# Patient Record
Sex: Male | Born: 1960 | Race: White | Hispanic: No | Marital: Married | Smoking: Current some day smoker
Health system: Southern US, Community
[De-identification: ages and names within clinical notes are randomized; demographics above are authoritative.]

## PROBLEM LIST (undated history)

## (undated) DIAGNOSIS — Z973 Presence of spectacles and contact lenses: Secondary | ICD-10-CM

## (undated) DIAGNOSIS — G4730 Sleep apnea, unspecified: Secondary | ICD-10-CM

## (undated) DIAGNOSIS — F329 Major depressive disorder, single episode, unspecified: Secondary | ICD-10-CM

## (undated) DIAGNOSIS — I10 Essential (primary) hypertension: Secondary | ICD-10-CM

## (undated) DIAGNOSIS — R972 Elevated prostate specific antigen [PSA]: Secondary | ICD-10-CM

## (undated) DIAGNOSIS — J189 Pneumonia, unspecified organism: Secondary | ICD-10-CM

## (undated) DIAGNOSIS — M1990 Unspecified osteoarthritis, unspecified site: Secondary | ICD-10-CM

## (undated) DIAGNOSIS — I82409 Acute embolism and thrombosis of unspecified deep veins of unspecified lower extremity: Secondary | ICD-10-CM

## (undated) DIAGNOSIS — F32A Depression, unspecified: Secondary | ICD-10-CM

## (undated) DIAGNOSIS — K219 Gastro-esophageal reflux disease without esophagitis: Secondary | ICD-10-CM

## (undated) DIAGNOSIS — N529 Male erectile dysfunction, unspecified: Secondary | ICD-10-CM

## (undated) DIAGNOSIS — D122 Benign neoplasm of ascending colon: Secondary | ICD-10-CM

## (undated) DIAGNOSIS — F419 Anxiety disorder, unspecified: Secondary | ICD-10-CM

## (undated) HISTORY — PX: SEPTOPLASTY: SUR1290

## (undated) HISTORY — PX: KNEE ARTHROSCOPY W/ ACL RECONSTRUCTION: SHX1858

## (undated) HISTORY — PX: COLONOSCOPY: SHX174

## (undated) HISTORY — PX: WISDOM TOOTH EXTRACTION: SHX21

---

## 1992-07-20 DIAGNOSIS — J189 Pneumonia, unspecified organism: Secondary | ICD-10-CM

## 1992-07-20 HISTORY — DX: Pneumonia, unspecified organism: J18.9

## 2005-06-09 ENCOUNTER — Ambulatory Visit: Payer: Self-pay | Admitting: Orthopaedic Surgery

## 2005-12-09 ENCOUNTER — Ambulatory Visit: Payer: Self-pay | Admitting: Family Medicine

## 2007-05-13 ENCOUNTER — Ambulatory Visit: Payer: Self-pay | Admitting: Family Medicine

## 2010-10-13 ENCOUNTER — Ambulatory Visit: Payer: Self-pay | Admitting: Family Medicine

## 2010-10-13 IMAGING — CR RIGHT FOOT COMPLETE - 3+ VIEW
1 series · 3 of 3 positions shown · non-contrast
Comparison: none

REASON FOR EXAM: right foot pain
COMMENTS:

PROCEDURE:     KDR - KDXR FOOT RT COMPLETE W/OBLIQUES  - [DATE]  [DATE]
RESULT:     There does not appear to be evidence of fracture, dislocation or
malalignment.  No evidence of soft tissue swelling is appreciated.

[Series 1: view not recorded · 0.17mm/px · 3 of 3 slices shown]
[im 1/3]
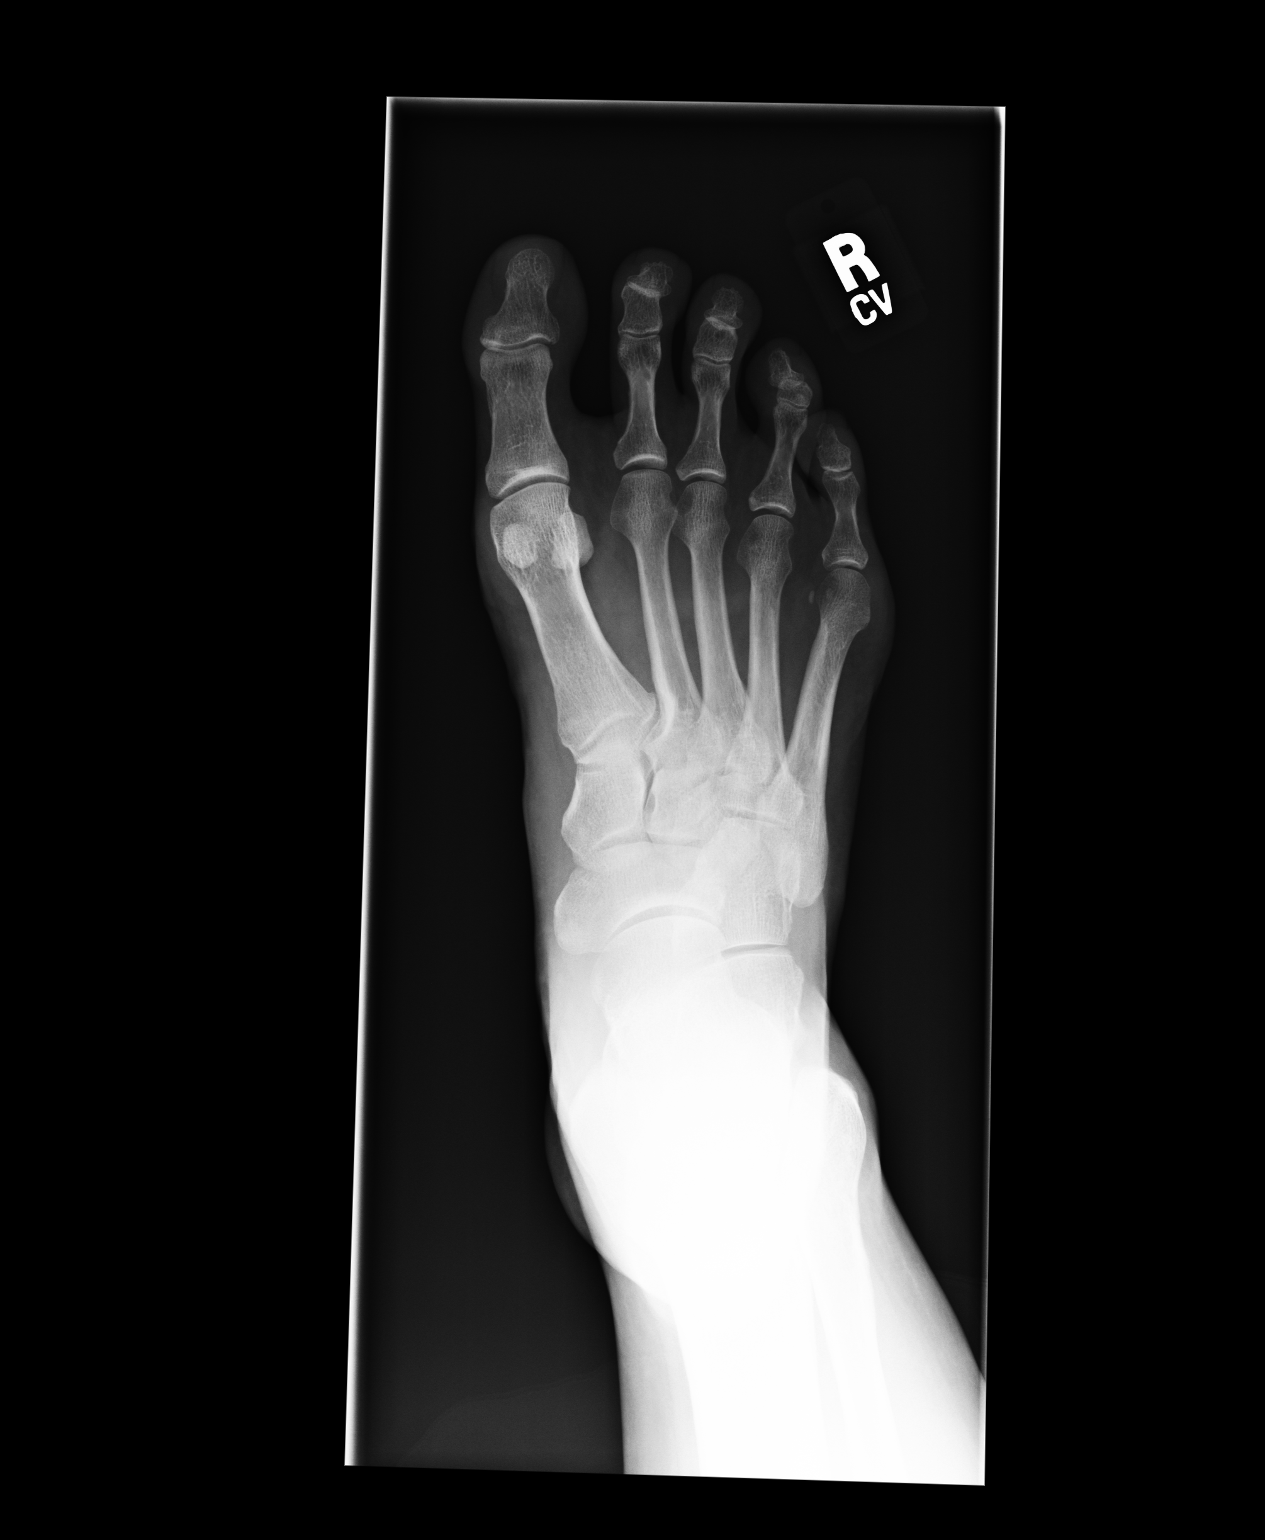
[im 2/3]
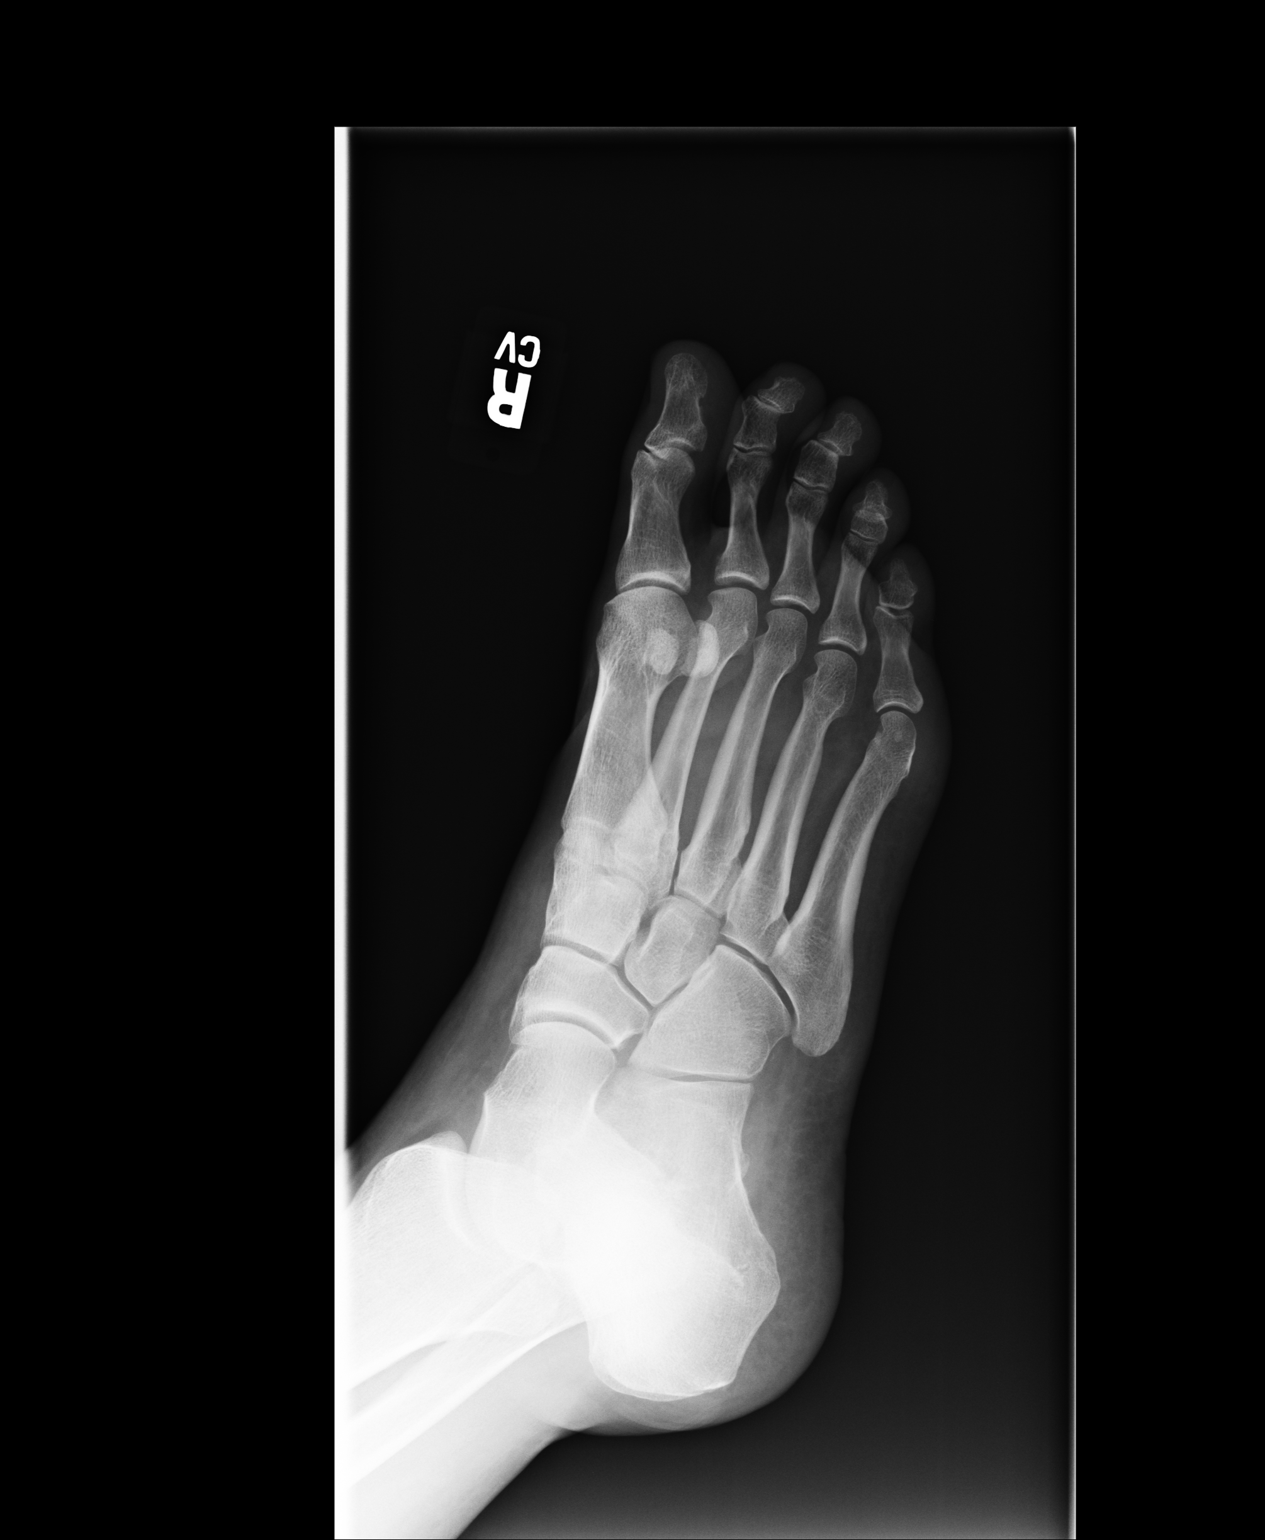
[im 3/3]
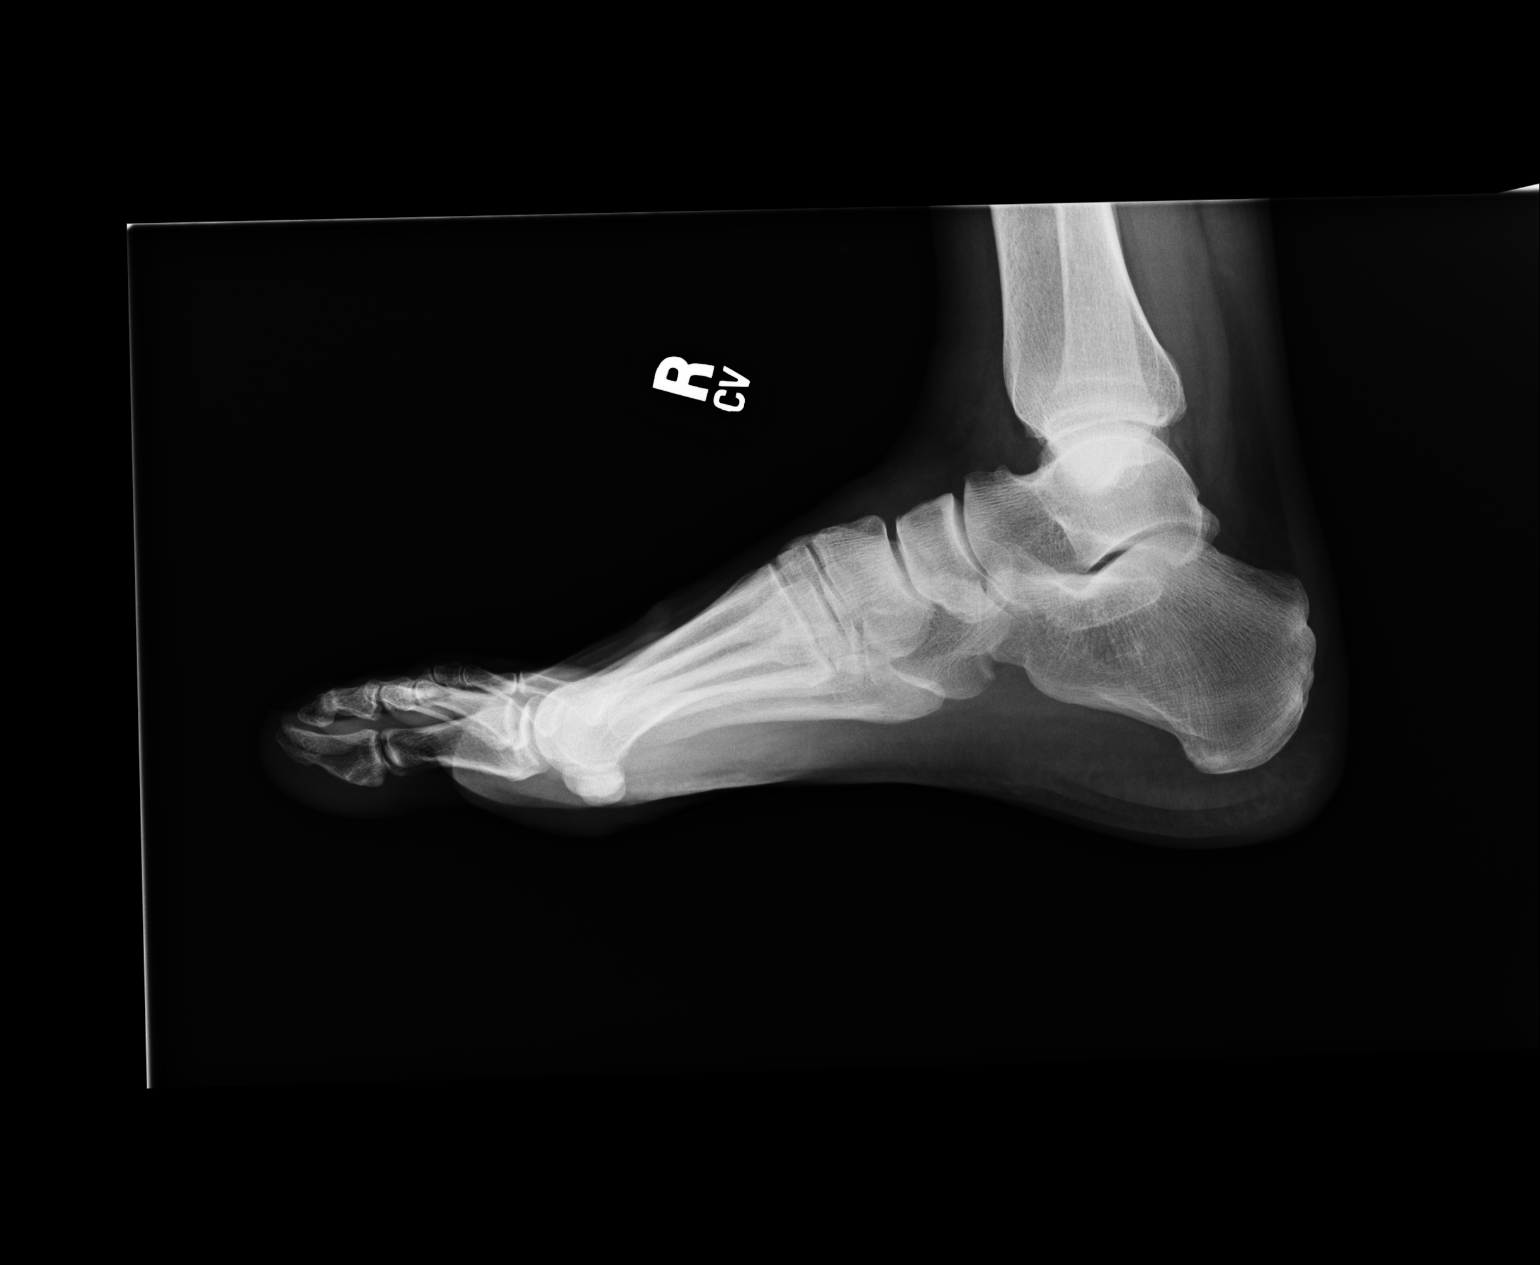

[3 of 3 positions shown; findings below may reference images not displayed]

IMPRESSION: No evidence of acute abnormalities.  If there are persistent complaints of
pain or persistent clinical concern, a repeat evaluation in 7-10 days is
recommended if clinically warranted.

## 2011-12-18 ENCOUNTER — Ambulatory Visit: Payer: Self-pay | Admitting: Orthopedic Surgery

## 2015-08-09 DIAGNOSIS — F4321 Adjustment disorder with depressed mood: Secondary | ICD-10-CM | POA: Diagnosis not present

## 2015-09-19 DIAGNOSIS — F4321 Adjustment disorder with depressed mood: Secondary | ICD-10-CM | POA: Diagnosis not present

## 2015-11-25 DIAGNOSIS — F331 Major depressive disorder, recurrent, moderate: Secondary | ICD-10-CM | POA: Diagnosis not present

## 2015-11-25 DIAGNOSIS — F900 Attention-deficit hyperactivity disorder, predominantly inattentive type: Secondary | ICD-10-CM | POA: Diagnosis not present

## 2015-11-29 DIAGNOSIS — F4321 Adjustment disorder with depressed mood: Secondary | ICD-10-CM | POA: Diagnosis not present

## 2016-01-28 ENCOUNTER — Other Ambulatory Visit: Payer: Self-pay | Admitting: Internal Medicine

## 2016-01-28 ENCOUNTER — Ambulatory Visit
Admission: RE | Admit: 2016-01-28 | Discharge: 2016-01-28 | Disposition: A | Discharge status: Home / Self Care | Payer: 59 | Source: Ambulatory Visit | Attending: Internal Medicine | Admitting: Internal Medicine

## 2016-01-28 DIAGNOSIS — M545 Low back pain: Secondary | ICD-10-CM

## 2016-01-28 DIAGNOSIS — M47816 Spondylosis without myelopathy or radiculopathy, lumbar region: Secondary | ICD-10-CM | POA: Diagnosis not present

## 2016-01-28 DIAGNOSIS — R52 Pain, unspecified: Secondary | ICD-10-CM

## 2016-03-27 DIAGNOSIS — F4321 Adjustment disorder with depressed mood: Secondary | ICD-10-CM | POA: Diagnosis not present

## 2016-05-08 DIAGNOSIS — F4321 Adjustment disorder with depressed mood: Secondary | ICD-10-CM | POA: Diagnosis not present

## 2016-07-10 DIAGNOSIS — F4321 Adjustment disorder with depressed mood: Secondary | ICD-10-CM | POA: Diagnosis not present

## 2016-08-07 DIAGNOSIS — F4321 Adjustment disorder with depressed mood: Secondary | ICD-10-CM | POA: Diagnosis not present

## 2016-09-01 DIAGNOSIS — G4733 Obstructive sleep apnea (adult) (pediatric): Secondary | ICD-10-CM | POA: Diagnosis not present

## 2016-09-14 DIAGNOSIS — G589 Mononeuropathy, unspecified: Secondary | ICD-10-CM | POA: Diagnosis not present

## 2016-09-25 DIAGNOSIS — F4321 Adjustment disorder with depressed mood: Secondary | ICD-10-CM | POA: Diagnosis not present

## 2016-12-04 DIAGNOSIS — K219 Gastro-esophageal reflux disease without esophagitis: Secondary | ICD-10-CM | POA: Diagnosis not present

## 2016-12-04 DIAGNOSIS — R1310 Dysphagia, unspecified: Secondary | ICD-10-CM | POA: Diagnosis not present

## 2016-12-04 DIAGNOSIS — Z8371 Family history of colonic polyps: Secondary | ICD-10-CM | POA: Diagnosis not present

## 2017-01-07 DIAGNOSIS — D175 Benign lipomatous neoplasm of intra-abdominal organs: Secondary | ICD-10-CM | POA: Diagnosis not present

## 2017-01-07 DIAGNOSIS — Z1211 Encounter for screening for malignant neoplasm of colon: Secondary | ICD-10-CM | POA: Diagnosis not present

## 2017-01-07 DIAGNOSIS — K210 Gastro-esophageal reflux disease with esophagitis: Secondary | ICD-10-CM | POA: Diagnosis not present

## 2017-01-07 DIAGNOSIS — Z8371 Family history of colonic polyps: Secondary | ICD-10-CM | POA: Diagnosis not present

## 2017-01-07 DIAGNOSIS — R1310 Dysphagia, unspecified: Secondary | ICD-10-CM | POA: Diagnosis not present

## 2017-01-08 DIAGNOSIS — F4321 Adjustment disorder with depressed mood: Secondary | ICD-10-CM | POA: Diagnosis not present

## 2017-02-22 DIAGNOSIS — J3489 Other specified disorders of nose and nasal sinuses: Secondary | ICD-10-CM | POA: Diagnosis not present

## 2017-02-22 DIAGNOSIS — J343 Hypertrophy of nasal turbinates: Secondary | ICD-10-CM | POA: Diagnosis not present

## 2017-02-22 DIAGNOSIS — R220 Localized swelling, mass and lump, head: Secondary | ICD-10-CM | POA: Diagnosis not present

## 2017-02-22 DIAGNOSIS — K219 Gastro-esophageal reflux disease without esophagitis: Secondary | ICD-10-CM | POA: Diagnosis not present

## 2017-02-22 DIAGNOSIS — J310 Chronic rhinitis: Secondary | ICD-10-CM | POA: Diagnosis not present

## 2017-02-22 DIAGNOSIS — J342 Deviated nasal septum: Secondary | ICD-10-CM | POA: Diagnosis not present

## 2017-03-03 DIAGNOSIS — G4733 Obstructive sleep apnea (adult) (pediatric): Secondary | ICD-10-CM | POA: Diagnosis not present

## 2017-03-12 ENCOUNTER — Other Ambulatory Visit: Payer: Self-pay | Admitting: Gastroenterology

## 2017-03-12 DIAGNOSIS — R0981 Nasal congestion: Secondary | ICD-10-CM | POA: Diagnosis not present

## 2017-03-12 DIAGNOSIS — R220 Localized swelling, mass and lump, head: Secondary | ICD-10-CM | POA: Diagnosis not present

## 2017-03-12 DIAGNOSIS — J343 Hypertrophy of nasal turbinates: Secondary | ICD-10-CM | POA: Diagnosis not present

## 2017-03-16 DIAGNOSIS — R0981 Nasal congestion: Secondary | ICD-10-CM | POA: Insufficient documentation

## 2017-03-16 DIAGNOSIS — J3489 Other specified disorders of nose and nasal sinuses: Secondary | ICD-10-CM | POA: Insufficient documentation

## 2017-03-16 DIAGNOSIS — J343 Hypertrophy of nasal turbinates: Secondary | ICD-10-CM | POA: Insufficient documentation

## 2017-04-02 DIAGNOSIS — F4321 Adjustment disorder with depressed mood: Secondary | ICD-10-CM | POA: Diagnosis not present

## 2017-04-21 ENCOUNTER — Encounter (HOSPITAL_COMMUNITY): Payer: Self-pay | Admitting: *Deleted

## 2017-04-21 NOTE — Progress Notes (Signed)
Pt denies SOB, chest pain, and being under the care of a cardiologist. Pt denies having a cardiac cath but stated that a stressecho was performed by Dr. 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 Pt added that a colonoscopy  was just performed 2 months ago. Pt made aware to stop taking Aspirin, vitamins, fish oil and herbal medications. Do not take any NSAIDs ie: Ibuprofen, Advil, Naproxen(Aleve), Motrin, BC and Goody Powder. Pt verbalized understanding of all pre-op instructions.

## 2017-04-27 ENCOUNTER — Encounter (HOSPITAL_COMMUNITY): Payer: Self-pay | Admitting: *Deleted

## 2017-05-04 ENCOUNTER — Ambulatory Visit (HOSPITAL_COMMUNITY)
Admission: RE | Admit: 2017-05-04 | Discharge: 2017-05-04 | Disposition: A | Discharge status: Home / Self Care | Payer: 59 | Source: Ambulatory Visit | Attending: Gastroenterology | Admitting: Gastroenterology

## 2017-05-04 ENCOUNTER — Ambulatory Visit (HOSPITAL_COMMUNITY): Payer: 59 | Admitting: Anesthesiology

## 2017-05-04 ENCOUNTER — Encounter (HOSPITAL_COMMUNITY)
Admission: RE | Disposition: A | Discharge status: Home / Self Care | Payer: Self-pay | Source: Ambulatory Visit | Attending: Gastroenterology

## 2017-05-04 ENCOUNTER — Encounter (HOSPITAL_COMMUNITY): Payer: Self-pay | Admitting: *Deleted

## 2017-05-04 DIAGNOSIS — G4730 Sleep apnea, unspecified: Secondary | ICD-10-CM | POA: Insufficient documentation

## 2017-05-04 DIAGNOSIS — Z8371 Family history of colonic polyps: Secondary | ICD-10-CM | POA: Insufficient documentation

## 2017-05-04 DIAGNOSIS — D126 Benign neoplasm of colon, unspecified: Secondary | ICD-10-CM | POA: Insufficient documentation

## 2017-05-04 DIAGNOSIS — Z1211 Encounter for screening for malignant neoplasm of colon: Secondary | ICD-10-CM | POA: Insufficient documentation

## 2017-05-04 DIAGNOSIS — Z86010 Personal history of colonic polyps: Secondary | ICD-10-CM | POA: Diagnosis not present

## 2017-05-04 DIAGNOSIS — D124 Benign neoplasm of descending colon: Secondary | ICD-10-CM | POA: Diagnosis not present

## 2017-05-04 DIAGNOSIS — D128 Benign neoplasm of rectum: Secondary | ICD-10-CM | POA: Diagnosis not present

## 2017-05-04 DIAGNOSIS — K621 Rectal polyp: Secondary | ICD-10-CM | POA: Diagnosis not present

## 2017-05-04 DIAGNOSIS — D122 Benign neoplasm of ascending colon: Secondary | ICD-10-CM | POA: Diagnosis not present

## 2017-05-04 HISTORY — DX: Benign neoplasm of ascending colon: D12.2

## 2017-05-04 HISTORY — PX: COLONOSCOPY WITH PROPOFOL: SHX5780

## 2017-05-04 HISTORY — PX: HOT HEMOSTASIS: SHX5433

## 2017-05-04 HISTORY — DX: Sleep apnea, unspecified: G47.30

## 2017-05-04 SURGERY — COLONOSCOPY WITH PROPOFOL
Anesthesia: Monitor Anesthesia Care

## 2017-05-04 MED ORDER — PROPOFOL 10 MG/ML IV BOLUS
INTRAVENOUS | Status: DC | PRN
  Administered 2017-05-04 (×3): 20 mg via INTRAVENOUS

## 2017-05-04 MED ORDER — ONDANSETRON HCL 4 MG/2ML IJ SOLN
INTRAMUSCULAR | Status: AC
  Filled 2017-05-04: qty 2

## 2017-05-04 MED ORDER — SODIUM CHLORIDE 0.9 % IV SOLN
INTRAVENOUS | Status: DC
Start: 2017-05-04 — End: 2017-05-04

## 2017-05-04 MED ORDER — LACTATED RINGERS IV SOLN
INTRAVENOUS | Status: DC
  Administered 2017-05-04: 13:00:00 via INTRAVENOUS

## 2017-05-04 MED ORDER — PROPOFOL 10 MG/ML IV BOLUS
INTRAVENOUS | Status: AC
  Filled 2017-05-04: qty 20

## 2017-05-04 MED ORDER — PROPOFOL 10 MG/ML IV BOLUS
INTRAVENOUS | Status: AC
  Filled 2017-05-04: qty 40

## 2017-05-04 MED ORDER — PROPOFOL 500 MG/50ML IV EMUL
INTRAVENOUS | Status: DC | PRN
  Administered 2017-05-04: 150 ug/kg/min via INTRAVENOUS

## 2017-05-04 MED ORDER — ONDANSETRON HCL 4 MG/2ML IJ SOLN
INTRAMUSCULAR | Status: DC | PRN
  Administered 2017-05-04: 4 mg via INTRAVENOUS

## 2017-05-04 MED ORDER — SODIUM CHLORIDE 0.9 % IV SOLN: INTRAVENOUS | Status: DC

## 2017-05-04 SURGICAL SUPPLY — 22 items

## 2017-05-04 NOTE — Discharge Instructions (Signed)
No aspirin, ibuprofen or other NSAID medications for 5 days. °Office will send note or call when pathology results are obtained. °Colonoscopy will be repeated based on the pathology results. °

## 2017-05-04 NOTE — Anesthesia Postprocedure Evaluation (Signed)
Anesthesia Post Note  Patient: Zachary Chung  Procedure(s) Performed: COLONOSCOPY WITH PROPOFOL (N/A ) HOT HEMOSTASIS (ARGON PLASMA COAGULATION/BICAP) (N/A )     Patient location during evaluation: PACU Anesthesia Type: MAC Level of consciousness: awake and alert Pain management: pain level controlled Vital Signs Assessment: post-procedure vital signs reviewed and stable Respiratory status: spontaneous breathing, nonlabored ventilation, respiratory function stable and patient connected to nasal cannula oxygen Cardiovascular status: stable and blood pressure returned to baseline Postop Assessment: no apparent nausea or vomiting Anesthetic complications: no    Last Vitals:  Vitals:   05/04/17 1410 05/04/17 1420  BP: (!) 141/89 (!) 142/88  Pulse: (!) 55 (!) 51  Resp: 18 16  Temp:    SpO2: 97% 100%    Last Pain:  Vitals:   05/04/17 1356  TempSrc: Oral                 Toya Palacios DAVID

## 2017-05-04 NOTE — Anesthesia Preprocedure Evaluation (Signed)
Anesthesia Evaluation  Patient identified by MRN, date of birth, ID band Patient awake    Reviewed: Allergy & Precautions, NPO status , Patient's Chart, lab work & pertinent test results  Airway Mallampati: I  TM Distance: >3 FB Neck ROM: Full    Dental   Pulmonary sleep apnea and Continuous Positive Airway Pressure Ventilation ,    Pulmonary exam normal        Cardiovascular Normal cardiovascular exam     Neuro/Psych    GI/Hepatic   Endo/Other    Renal/GU      Musculoskeletal   Abdominal   Peds  Hematology   Anesthesia Other Findings   Reproductive/Obstetrics                             Anesthesia Physical Anesthesia Plan  ASA: II  Anesthesia Plan: MAC   Post-op Pain Management:    Induction: Intravenous  PONV Risk Score and Plan: 1 and Ondansetron and Treatment may vary due to age or medical condition  Airway Management Planned:   Additional Equipment:   Intra-op Plan:   Post-operative Plan:   Informed Consent: I have reviewed the patients History and Physical, chart, labs and discussed the procedure including the risks, benefits and alternatives for the proposed anesthesia with the patient or authorized representative who has indicated his/her understanding and acceptance.     Plan Discussed with: CRNA and Surgeon  Anesthesia Plan Comments:         Anesthesia Quick Evaluation

## 2017-05-04 NOTE — Transfer of Care (Signed)
Immediate Anesthesia Transfer of Care Note  Patient: Zachary Chung  Procedure(s) Performed: COLONOSCOPY WITH PROPOFOL (N/A ) HOT HEMOSTASIS (ARGON PLASMA COAGULATION/BICAP) (N/A )  Patient Location: PACU  Anesthesia Type:MAC  Level of Consciousness: awake, alert  and oriented  Airway & Oxygen Therapy: Patient Spontanous Breathing  Post-op Assessment: Report given to RN  Post vital signs: Reviewed and stable  Last Vitals: 120/73, 79, 20, 100%, 97.6 Vitals:   05/04/17 1225  BP: (!) 156/104  Pulse: 75  Resp: 17  Temp: 36.7 C  SpO2: 99%    Last Pain:  Vitals:   05/04/17 1225  TempSrc: Oral         Complications: No apparent anesthesia complications

## 2017-05-04 NOTE — Op Note (Addendum)
[facility] Patient Name: Zachary Chung Procedure Date: 05/04/2017 MRN: [id number] Attending MD: Nancy Fetter Dr., MD Date of Birth: Jan 01, 1961 CSN: [id number] Age: 56 Admit Type: Outpatient Procedure:                Colonoscopy Indications:              High risk colon cancer surveillance: Personal                            history of colonic polyps. Patient had colonoscopy                            6/18. This revealed small lipoma in the descending                            colon with the red area that was biopsy that                            returned is a serrated polyp. Plan was to go back                            and remove this or to ablate it. This had been                            discussed with the patient including the possible                            use of the APC. It's notable that on that                            colonoscopy the patient's prep was only fair and                            there were several areas that were obscured by                            inherent sticky stool Providers:                Jeneen Rinks L. Hollyanne Schloesser Dr., MD, Burtis Junes, RN, Tinnie Gens, Technician Referring MD:             Leona Carry. Hall Busing, MD Medicines:                Monitored Anesthesia Care Complications:            No immediate complications. Estimated Blood Loss:     Estimated blood loss: none. Procedure:                Pre-Anesthesia Assessment:                           - ASA Grade Assessment: II - A patient with mild  systemic disease.                           - Prior to the procedure, a History and Physical                            was performed, and patient medications and                            allergies were reviewed. The patient's tolerance of                            previous anesthesia was also reviewed. The risks                            and benefits of the procedure and the sedation                             options and risks were discussed with the patient.                            All questions were answered, and informed consent                            was obtained. Prior Anticoagulants: The patient has                            taken no previous anticoagulant or antiplatelet                            agents. ASA Grade Assessment: II - A patient with                            mild systemic disease. After reviewing the risks                            and benefits, the patient was deemed in                            satisfactory condition to undergo the procedure.                           After obtaining informed consent, the colonoscope                            was passed under direct vision. Throughout the                            procedure, the patient's blood pressure, pulse, and                            oxygen saturations were monitored continuously.The  colonoscopy was technically difficult and complex                            due to significant looping and a tortuous colon.                            Successful completion of the procedure was aided by                            applying abdominal pressure. The patient tolerated                            the procedure well. The quality of the bowel                            preparation was good. The ileocecal valve,                            appendiceal orifice, and rectum were photographed.                            The EC-[id number] ([id number]) scope was introduced                            through the anus and advanced to the the cecum,                            identified by appendiceal orifice and ileocecal                            valve. Scope In: 1:00:53 PM Scope Out: 1:46:29 PM Scope Withdrawal Time: 0 hours 28 minutes 6 seconds  Total Procedure Duration: 0 hours 45 minutes 36 seconds  Findings:      The perianal and digital rectal examinations were  normal.      A 3 mm polyp was found in the proximal ascending colon. The polyp was       sessile. This was actually a reddened area that was on top of what had       been proven to be a lipoma. We had biopsy this on his previous       colonoscopy showing a serrated polyp. I did attempt to get the small       snare around it but it was quite flat. Since it had previously been       biopsy I elected to go ahead and cauterize it with the tip of the snare.       This resulted in good results.      A 5 mm polyp was found in the descending colon. The polyp was sessile.       The polyp was removed with a hot snare. Resection and retrieval were       complete. The pathology specimen was placed into Bottle Number 1.      A 15 mm polyp was found in the rectum. The polyp was multi-lobulated and       sessile. Biopsies were taken with a cold forceps for histology. The  pathology specimen was placed into Bottle Number 2. This was in the very       distal part of the rectum and extended partially into the anal canal.       There were hemorrhoids seen as well. Impression:               - One 3 mm polyp in the proximal ascending colon.                           - One 5 mm polyp in the descending colon, removed                            with a hot snare. Resected and retrieved.                           - One 15 mm polyp in the rectum. Biopsied. Moderate Sedation:      MAC by anesthesia Recommendation:           - Patient has a contact number available for                            emergencies. The signs and symptoms of potential                            delayed complications were discussed with the                            patient. Return to normal activities tomorrow.                            Written discharge instructions were provided to the                            patient.                           - Resume previous diet.                           - Continue present medications.                            - No aspirin, ibuprofen, naproxen, or other                            non-steroidal anti-inflammatory drugs for 5 days                            after polyp removal.                           - Repeat colonoscopy for surveillance based on                            pathology results.                           -  Refer to a colo-rectal surgeon at appointment to                            be scheduled. Procedure Code(s):        --- Professional ---                           [phone], Colonoscopy, flexible; with removal of                            tumor(s), polyp(s), or other lesion(s) by snare                            technique                           45380, 85, Colonoscopy, flexible; with biopsy,                            single or multiple Diagnosis Code(s):        --- Professional ---                           K62.1, Rectal polyp                           D12.4, Benign neoplasm of descending colon                           D12.2, Benign neoplasm of ascending colon                           Z86.010, Personal history of colonic polyps CPT copyright 2016 American Medical Association. All rights reserved. The codes documented in this report are preliminary and upon coder review may  be revised to meet current compliance requirements. Nancy Fetter Dr., MD 05/04/2017 2:01:41 PM This report has been signed electronically. Number of Addenda: 0

## 2017-05-04 NOTE — H&P (Signed)
Subjective:   Patient is a 56 y.o. male presents with polyp in the [place] it was actually sitting on a lipoma,biopsy proved this to be a serrated adenoma. He is back here now to have thisserrated adenoma ablated or removed. Have discussed polypectomy with him in the past and discussed again today including the possible use of the APC Procedure including risks and benefits discussed in office.  There are no active problems to display for this patient.  Past Medical History:  Diagnosis Date  . Adenomatous polyp of ascending colon   . Sleep apnea    wears CPAP    Past Surgical History:  Procedure Laterality Date  . COLONOSCOPY    . KNEE ARTHROSCOPY W/ ACL RECONSTRUCTION     left  . WISDOM TOOTH EXTRACTION      Prescriptions Prior to Admission  Medication Sig Dispense Refill Last Dose  . fluticasone (FLONASE) 50 MCG/ACT nasal spray Place 1 spray into both nostrils daily as needed for allergies.  12 Past Week at Unknown time  . pantoprazole (PROTONIX) 40 MG tablet Take 40 mg by mouth daily.   05/03/2017 at Unknown time  . ranitidine (ZANTAC) 150 MG tablet Take 150 mg by mouth every evening.   Past Week at Unknown time  . temazepam (RESTORIL) 30 MG capsule Take 30 mg by mouth at bedtime as needed for sleep.   05/03/2017 at Unknown time   Allergies  Allergen Reactions  . Tetracyclines & Related Rash    Social History  Substance Use Topics  . Smoking status: Never Smoker  . Smokeless tobacco: Never Used  . Alcohol use Yes     Comment: 1-2 beers 4 days /week    Family History  Problem Relation Age of Onset  . Parkinson's disease Mother   . Heart disease Father      Objective:   Patient Vitals for the past 8 hrs:  BP Temp Temp src Pulse Resp SpO2 Height Weight  05/04/17 1225 (!) 156/104 98.1 F (36.7 C) Oral 75 17 99 % 6\' 4"  (1.93 m) 113.4 kg (250 lb)   No intake/output data recorded. No intake/output data recorded.   See MD Preop evaluation      Assessment:   1.  Serrated adenoma in the a sending colon has relatives with colon polyps.  Plan:   Will proceed with repeat colonoscopy, removal of adenoma and possible oblation of adenoma with APC. Procedure discussed with the patient and wife.

## 2017-05-06 ENCOUNTER — Encounter (HOSPITAL_COMMUNITY): Payer: Self-pay | Admitting: Gastroenterology

## 2017-05-12 ENCOUNTER — Telehealth: Payer: Self-pay | Admitting: *Deleted

## 2017-05-12 NOTE — Telephone Encounter (Signed)
Patient came by the office today.   We discussed the patient having surgery on 06-03-17.   He will double check his schedule and call the office back to confirm date. Once date confirmed, we will post with the O.R. Patient is requesting to be first case.

## 2017-05-12 NOTE — Telephone Encounter (Signed)
Message left for patient to call the office.

## 2017-05-13 NOTE — Telephone Encounter (Signed)
Patient's surgery has been scheduled for 06-03-17 per patient request.   Surgery instructions will be reviewed with the patient at his pre-op visit next week as requested.

## 2017-05-19 ENCOUNTER — Ambulatory Visit (INDEPENDENT_AMBULATORY_CARE_PROVIDER_SITE_OTHER): Payer: 59 | Admitting: General Surgery

## 2017-05-19 ENCOUNTER — Encounter: Payer: Self-pay | Admitting: General Surgery

## 2017-05-19 VITALS — BP 138/94 | HR 68 | Resp 16 | Ht 76.0 in | Wt 257.0 lb

## 2017-05-19 DIAGNOSIS — K621 Rectal polyp: Secondary | ICD-10-CM | POA: Diagnosis not present

## 2017-05-19 NOTE — Progress Notes (Signed)
Patient ID: Zachary Chung, male   DOB: 13-Aug-1960, 56 y.o.   MRN: [id number]  Chief Complaint  Patient presents with  . Other    HPI Zachary Chung is a 56 y.o. male.  Her for rectal polyp evaluation. Bowles move regular no bleeding. Denies abdominal pain.  Colonoscopy was 05-04-17 by Dr Oletta Lamas.  HPI  Past Medical History:  Diagnosis Date  . Adenomatous polyp of ascending colon   . Sleep apnea    wears CPAP    Past Surgical History:  Procedure Laterality Date  . COLONOSCOPY    . COLONOSCOPY WITH PROPOFOL N/A 05/04/2017   Procedure: COLONOSCOPY WITH PROPOFOL;  Surgeon: Laurence Spates, MD;  Location: [facility];  Service: Endoscopy;  Laterality: N/A;  . HOT HEMOSTASIS N/A 05/04/2017   Procedure: HOT HEMOSTASIS (ARGON PLASMA COAGULATION/BICAP);  Surgeon: Laurence Spates, MD;  Location: [facility];  Service: Endoscopy;  Laterality: N/A;  . KNEE ARTHROSCOPY W/ ACL RECONSTRUCTION     left  . WISDOM TOOTH EXTRACTION      Family History  Problem Relation Age of Onset  . Parkinson's disease Mother   . Colon polyps Mother   . Heart disease Father   . Colon cancer Maternal Uncle 31    Social History Social History  Substance Use Topics  . Smoking status: Never Smoker  . Smokeless tobacco: Never Used  . Alcohol use Yes     Comment: 1-2 beers 4 days /week    Allergies  Allergen Reactions  . Tetracyclines & Related Rash    Current Outpatient Prescriptions  Medication Sig Dispense Refill  . fluticasone (FLONASE) 50 MCG/ACT nasal spray Place 1 spray into both nostrils daily as needed for allergies.  12  . Melatonin 10 MG CAPS Take by mouth as needed.    . pantoprazole (PROTONIX) 40 MG tablet Take 40 mg by mouth daily.    . ranitidine (ZANTAC) 150 MG tablet Take 150 mg by mouth every evening.    . temazepam (RESTORIL) 30 MG capsule Take 30 mg by mouth at bedtime as needed for sleep.     No current facility-administered medications for this visit.     Review of  Systems Review of Systems  Constitutional: Negative.   Respiratory: Negative.   Cardiovascular: Negative.     Blood pressure (!) 138/94, pulse 68, resp. rate 16, height 6\' 4"  (1.93 m), weight 257 lb (116.6 kg).  Physical Exam Physical Exam  Constitutional: He is oriented to person, place, and time. He appears well-developed and well-nourished.  HENT:  Mouth/Throat: Oropharynx is clear and moist.  Eyes: Conjunctivae are normal. No scleral icterus.  Neck: Neck supple.  Cardiovascular: Normal rate, regular rhythm and normal heart sounds.   Pulmonary/Chest: Effort normal and breath sounds normal.  Genitourinary:     Lymphadenopathy:    He has no cervical adenopathy.  Neurological: He is alert and oriented to person, place, and time.  Skin: Skin is warm and dry.  Psychiatric: His behavior is normal.    Data Reviewed Colonoscopy of May 04, 2017. 1. Colon, polyp(s), descending - TUBULAR ADENOMA(X1). - HIGH GRADE DYSPLASIA IS NOT IDENTIFIED. 2. Rectum, polyp(s), distal rectal polypoid mass - TUBULOVILLOUS ADENOMA(S). - HIGH GRADE DYSPLASIA IS NOT IDENTIFIED.   Assessment    Low rectal polyp, candidate for transanal excision.    Plan    Occasions for transanal excision reviewed.  Risks including bleeding, incontinence discussed.  Patient to make use of single fleets enema prior to the procedure.  Discussed transanal excision rectal polyp.  The patient is aware to call back for any questions or concerns.   Zachary Chung 05/19/2017, 1:32 PM  Patient's surgery has been scheduled for 06-03-17 at [facility].  Zachary Chung, CMA

## 2017-05-19 NOTE — Patient Instructions (Signed)
The patient is aware to call back for any questions or concerns.  

## 2017-05-20 DIAGNOSIS — J343 Hypertrophy of nasal turbinates: Secondary | ICD-10-CM | POA: Diagnosis not present

## 2017-05-20 DIAGNOSIS — J342 Deviated nasal septum: Secondary | ICD-10-CM | POA: Diagnosis not present

## 2017-05-20 DIAGNOSIS — G4733 Obstructive sleep apnea (adult) (pediatric): Secondary | ICD-10-CM | POA: Diagnosis not present

## 2017-05-20 DIAGNOSIS — E669 Obesity, unspecified: Secondary | ICD-10-CM | POA: Diagnosis not present

## 2017-05-20 DIAGNOSIS — J329 Chronic sinusitis, unspecified: Secondary | ICD-10-CM | POA: Diagnosis not present

## 2017-05-20 DIAGNOSIS — J3489 Other specified disorders of nose and nasal sinuses: Secondary | ICD-10-CM | POA: Diagnosis not present

## 2017-05-20 DIAGNOSIS — R0981 Nasal congestion: Secondary | ICD-10-CM | POA: Diagnosis not present

## 2017-05-20 DIAGNOSIS — K219 Gastro-esophageal reflux disease without esophagitis: Secondary | ICD-10-CM | POA: Diagnosis not present

## 2017-05-20 DIAGNOSIS — Q219 Congenital malformation of cardiac septum, unspecified: Secondary | ICD-10-CM | POA: Diagnosis not present

## 2017-05-20 HISTORY — PX: SINUS SURGERY WITH INSTATRAK: SHX5215

## 2017-05-27 ENCOUNTER — Encounter
Admission: RE | Admit: 2017-05-27 | Discharge: 2017-05-27 | Disposition: A | Discharge status: Home / Self Care | Payer: 59 | Source: Ambulatory Visit | Attending: General Surgery | Admitting: General Surgery

## 2017-05-27 ENCOUNTER — Other Ambulatory Visit: Payer: Self-pay

## 2017-05-27 ENCOUNTER — Other Ambulatory Visit: Payer: 59

## 2017-05-27 HISTORY — DX: Gastro-esophageal reflux disease without esophagitis: K21.9

## 2017-05-27 HISTORY — DX: Depression, unspecified: F32.A

## 2017-05-27 HISTORY — DX: Pneumonia, unspecified organism: J18.9

## 2017-05-27 HISTORY — DX: Major depressive disorder, single episode, unspecified: F32.9

## 2017-05-27 NOTE — Patient Instructions (Signed)
  Your procedure is scheduled on: 06-03-17  Report to Same Day Surgery 2nd floor medical mall [facility] Entrance-take elevator on left to 2nd floor.  Check in with surgery information desk.) To find out your arrival time please call [phone] between 1PM - 3PM on 06-02-17  Remember: Instructions that are not followed completely may result in serious medical risk, up to and including death, or upon the discretion of your surgeon and anesthesiologist your surgery may need to be rescheduled.    _x___ 1. Do not eat food after midnight the night before your procedure. NO GUM CHEWING OR CANDY AFTER MIDNIGHT-You may drink clear liquids up to 2 hours before you are scheduled to arrive at the hospital for your procedure.  Do not drink clear liquids within 2 hours of your scheduled arrival to the hospital.  Clear liquids include  --Water or Apple juice without pulp  --Clear carbohydrate beverage such as ClearFast or Gatorade  --Black Coffee or Clear Tea (No milk, no creamers, do not add anything to the coffee or Tea      __x__ 2. No Alcohol for 24 hours before or after surgery.   __x__3. No Smoking for 24 prior to surgery.   ____  4. Bring all medications with you on the day of surgery if instructed.    __x__ 5. Notify your doctor if there is any change in your medical condition     (cold, fever, infections).     Do not wear jewelry, make-up, hairpins, clips or nail polish.  Do not wear lotions, powders, or perfumes. You may wear deodorant.  Do not shave 48 hours prior to surgery. Men may shave face and neck.  Do not bring valuables to the hospital.    [facility] is not responsible for any belongings or valuables.               Contacts, dentures or bridgework may not be worn into surgery.  Leave your suitcase in the car. After surgery it may be brought to your room.  For patients admitted to the hospital, discharge time is determined by your treatment team.   Patients discharged  the day of surgery will not be allowed to drive home.  You will need someone to drive you home and stay with you the night of your procedure.    Please read over the following fact sheets that you were given:    _x___ [place] WITH A SMALL SIP OF WATER. These include:  1. PROTONIX  2.  3.  4.  5.  6.  ____Fleets enema or Magnesium Citrate as directed.   ____ Use CHG Soap or sage wipes as directed on instruction sheet   ____ Use inhalers on the day of surgery and bring to hospital day of surgery  ____ Stop Metformin and Janumet 2 days prior to surgery.    ____ Take 1/2 of usual insulin dose the night before surgery and none on the morning surgery.   ____ Follow recommendations from Cardiologist, Pulmonologist or PCP regarding stopping Aspirin, Coumadin, Plavix ,Eliquis, Effient, or Pradaxa, and Pletal.  ____Stop Anti-inflammatories such as Advil, Aleve, Ibuprofen, Motrin, Naproxen, Naprosyn, Goodies powders or aspirin products NOW-OK to take Tylenol    _x___ Stop supplements until after surgery-STOP MELATONIN NOW   ____ Bring C-Pap to the hospital.

## 2017-05-28 DIAGNOSIS — G4709 Other insomnia: Secondary | ICD-10-CM | POA: Diagnosis not present

## 2017-05-28 DIAGNOSIS — R030 Elevated blood-pressure reading, without diagnosis of hypertension: Secondary | ICD-10-CM | POA: Diagnosis not present

## 2017-05-28 DIAGNOSIS — E291 Testicular hypofunction: Secondary | ICD-10-CM | POA: Diagnosis not present

## 2017-05-28 DIAGNOSIS — E559 Vitamin D deficiency, unspecified: Secondary | ICD-10-CM | POA: Diagnosis not present

## 2017-06-02 MED ORDER — SODIUM CHLORIDE 0.9 % IV SOLN
1.0000 g | INTRAVENOUS | Status: AC
  Administered 2017-06-03: 1 g via INTRAVENOUS
  Filled 2017-06-02: qty 1

## 2017-06-03 ENCOUNTER — Encounter
Admission: RE | Disposition: A | Discharge status: Home / Self Care | Payer: Self-pay | Source: Ambulatory Visit | Attending: General Surgery

## 2017-06-03 ENCOUNTER — Ambulatory Visit: Payer: 59 | Admitting: Anesthesiology

## 2017-06-03 ENCOUNTER — Ambulatory Visit
Admission: RE | Admit: 2017-06-03 | Discharge: 2017-06-03 | Disposition: A | Discharge status: Home / Self Care | Payer: 59 | Source: Ambulatory Visit | Attending: General Surgery | Admitting: General Surgery

## 2017-06-03 ENCOUNTER — Other Ambulatory Visit: Payer: Self-pay

## 2017-06-03 ENCOUNTER — Other Ambulatory Visit: Payer: Self-pay | Admitting: General Surgery

## 2017-06-03 ENCOUNTER — Encounter: Payer: Self-pay | Admitting: *Deleted

## 2017-06-03 DIAGNOSIS — D128 Benign neoplasm of rectum: Secondary | ICD-10-CM | POA: Diagnosis not present

## 2017-06-03 DIAGNOSIS — I10 Essential (primary) hypertension: Secondary | ICD-10-CM | POA: Diagnosis not present

## 2017-06-03 DIAGNOSIS — K648 Other hemorrhoids: Secondary | ICD-10-CM | POA: Diagnosis not present

## 2017-06-03 DIAGNOSIS — G4730 Sleep apnea, unspecified: Secondary | ICD-10-CM | POA: Diagnosis not present

## 2017-06-03 DIAGNOSIS — Z79899 Other long term (current) drug therapy: Secondary | ICD-10-CM | POA: Insufficient documentation

## 2017-06-03 DIAGNOSIS — F329 Major depressive disorder, single episode, unspecified: Secondary | ICD-10-CM | POA: Diagnosis not present

## 2017-06-03 DIAGNOSIS — K621 Rectal polyp: Secondary | ICD-10-CM

## 2017-06-03 DIAGNOSIS — K219 Gastro-esophageal reflux disease without esophagitis: Secondary | ICD-10-CM | POA: Insufficient documentation

## 2017-06-03 DIAGNOSIS — D127 Benign neoplasm of rectosigmoid junction: Secondary | ICD-10-CM

## 2017-06-03 HISTORY — DX: Essential (primary) hypertension: I10

## 2017-06-03 HISTORY — PX: TRANSANAL EXCISION OF RECTAL MASS: SHX6134

## 2017-06-03 SURGERY — EXCISION, MASS, RECTUM, ANAL APPROACH
Anesthesia: General | Wound class: Contaminated

## 2017-06-03 MED ORDER — OXYCODONE HCL 5 MG PO TABS: 5.0000 mg | ORAL_TABLET | Freq: Once | ORAL | Status: DC | PRN

## 2017-06-03 MED ORDER — BUPIVACAINE-EPINEPHRINE (PF) 0.5% -1:200000 IJ SOLN
INTRAMUSCULAR | Status: AC
  Filled 2017-06-03: qty 30

## 2017-06-03 MED ORDER — BUPIVACAINE-EPINEPHRINE 0.5% -1:200000 IJ SOLN
INTRAMUSCULAR | Status: DC | PRN
  Administered 2017-06-03: 30 mL

## 2017-06-03 MED ORDER — PROPOFOL 10 MG/ML IV BOLUS
INTRAVENOUS | Status: DC | PRN
Start: 2017-06-03 — End: 2017-06-03
  Administered 2017-06-03: 200 mg via INTRAVENOUS

## 2017-06-03 MED ORDER — HYDROMORPHONE HCL 1 MG/ML IJ SOLN
INTRAMUSCULAR | Status: DC | PRN
  Administered 2017-06-03: 0.5 mg via INTRAVENOUS

## 2017-06-03 MED ORDER — FENTANYL CITRATE (PF) 100 MCG/2ML IJ SOLN
INTRAMUSCULAR | Status: AC
  Administered 2017-06-03: 25 ug via INTRAVENOUS
  Filled 2017-06-03: qty 2

## 2017-06-03 MED ORDER — ACETAMINOPHEN 10 MG/ML IV SOLN
INTRAVENOUS | Status: DC | PRN
  Administered 2017-06-03: 1000 mg via INTRAVENOUS

## 2017-06-03 MED ORDER — KETOROLAC TROMETHAMINE 30 MG/ML IJ SOLN
INTRAMUSCULAR | Status: DC | PRN
  Administered 2017-06-03: 30 mg via INTRAVENOUS

## 2017-06-03 MED ORDER — ACETAMINOPHEN 10 MG/ML IV SOLN
INTRAVENOUS | Status: AC
  Filled 2017-06-03: qty 100

## 2017-06-03 MED ORDER — OXYCODONE HCL 5 MG/5ML PO SOLN: 5.0000 mg | Freq: Once | ORAL | Status: DC | PRN

## 2017-06-03 MED ORDER — ONDANSETRON HCL 4 MG/2ML IJ SOLN
INTRAMUSCULAR | Status: AC
  Filled 2017-06-03: qty 2

## 2017-06-03 MED ORDER — FENTANYL CITRATE (PF) 100 MCG/2ML IJ SOLN
INTRAMUSCULAR | Status: DC | PRN
  Administered 2017-06-03 (×2): 50 ug via INTRAVENOUS

## 2017-06-03 MED ORDER — MIDAZOLAM HCL 2 MG/2ML IJ SOLN
INTRAMUSCULAR | Status: DC | PRN
Start: 2017-06-03 — End: 2017-06-03
  Administered 2017-06-03: 2 mg via INTRAVENOUS

## 2017-06-03 MED ORDER — ONDANSETRON HCL 4 MG/2ML IJ SOLN
INTRAMUSCULAR | Status: DC | PRN
  Administered 2017-06-03: 4 mg via INTRAVENOUS

## 2017-06-03 MED ORDER — PROPOFOL 10 MG/ML IV BOLUS
INTRAVENOUS | Status: AC
  Filled 2017-06-03: qty 20

## 2017-06-03 MED ORDER — LIDOCAINE HCL (CARDIAC) 20 MG/ML IV SOLN
INTRAVENOUS | Status: DC | PRN
  Administered 2017-06-03: 100 mg via INTRAVENOUS

## 2017-06-03 MED ORDER — DEXAMETHASONE SODIUM PHOSPHATE 10 MG/ML IJ SOLN
INTRAMUSCULAR | Status: DC | PRN
  Administered 2017-06-03: 10 mg via INTRAVENOUS

## 2017-06-03 MED ORDER — LACTATED RINGERS IV SOLN
INTRAVENOUS | Status: DC
  Administered 2017-06-03 (×2): via INTRAVENOUS

## 2017-06-03 MED ORDER — FENTANYL CITRATE (PF) 100 MCG/2ML IJ SOLN
INTRAMUSCULAR | Status: AC
  Filled 2017-06-03: qty 2

## 2017-06-03 MED ORDER — HYDROCODONE-ACETAMINOPHEN 5-325 MG PO TABS: 1.0000 | ORAL_TABLET | ORAL | 0 refills | Status: DC | PRN

## 2017-06-03 MED ORDER — DEXMEDETOMIDINE HCL 200 MCG/2ML IV SOLN
INTRAVENOUS | Status: DC | PRN
  Administered 2017-06-03: 12 ug via INTRAVENOUS

## 2017-06-03 MED ORDER — HYDROMORPHONE HCL 1 MG/ML IJ SOLN
INTRAMUSCULAR | Status: AC
Start: 2017-06-03 — End: ?
  Filled 2017-06-03: qty 1

## 2017-06-03 MED ORDER — FENTANYL CITRATE (PF) 100 MCG/2ML IJ SOLN
25.0000 ug | INTRAMUSCULAR | Status: DC | PRN
  Administered 2017-06-03 (×4): 25 ug via INTRAVENOUS

## 2017-06-03 MED ORDER — MIDAZOLAM HCL 2 MG/2ML IJ SOLN
INTRAMUSCULAR | Status: AC
  Filled 2017-06-03: qty 2

## 2017-06-03 SURGICAL SUPPLY — 40 items
BAG COUNTER SPONGE EZ (MISCELLANEOUS) ×1 IMPLANT
BAG SPNG 4X4 CLR HAZ (MISCELLANEOUS)
BLADE SURG 15 STRL SS SAFETY (BLADE) ×2 IMPLANT
BRIEF STRETCH MATERNITY 2XLG (MISCELLANEOUS) ×3 IMPLANT
CANISTER SUCT 1200ML W/VALVE (MISCELLANEOUS) ×3 IMPLANT
COUNTER SPONGE BAG EZ (MISCELLANEOUS)
DRAPE LAPAROTOMY 100X77 ABD (DRAPES) ×3 IMPLANT
DRAPE LEGGINS SURG 28X43 STRL (DRAPES) ×3 IMPLANT
DRAPE UNDER BUTTOCK W/FLU (DRAPES) ×3 IMPLANT
DRSG GAUZE PETRO 6X36 STRIP ST (GAUZE/BANDAGES/DRESSINGS) ×3 IMPLANT
DRSG TELFA 4X3 1S NADH ST (GAUZE/BANDAGES/DRESSINGS) ×2 IMPLANT
ELECT REM PT RETURN 9FT ADLT (ELECTROSURGICAL) ×3
ELECTRODE REM PT RTRN 9FT ADLT (ELECTROSURGICAL) IMPLANT
FILTER PURE VAC YLPA (MISCELLANEOUS) IMPLANT
FLTR PURE VAC YLPA (MISCELLANEOUS)
GLOVE BIO SURGEON STRL SZ 6.5 (GLOVE) ×2 IMPLANT
GLOVE BIO SURGEON STRL SZ7.5 (GLOVE) ×3 IMPLANT
GLOVE BIO SURGEONS STRL SZ 6.5 (GLOVE) ×2
GLOVE INDICATOR 8.0 STRL GRN (GLOVE) ×3 IMPLANT
GOWN STRL REUS W/ TWL LRG LVL3 (GOWN DISPOSABLE) ×2 IMPLANT
GOWN STRL REUS W/TWL LRG LVL3 (GOWN DISPOSABLE) ×6
LABEL OR SOLS (LABEL) ×1 IMPLANT
NDL SAFETY 22GX1.5 (NEEDLE) IMPLANT
NEEDLE HYPO 25X1 1.5 SAFETY (NEEDLE) IMPLANT
PACK BASIN MINOR ARMC (MISCELLANEOUS) ×3 IMPLANT
PAD OB MATERNITY 4.3X12.25 (PERSONAL CARE ITEMS) ×3 IMPLANT
PAD PREP 24X41 OB/GYN DISP (PERSONAL CARE ITEMS) ×3 IMPLANT
SHEARS FOC LG CVD HARMONIC 17C (MISCELLANEOUS) ×3 IMPLANT
STRAP SAFETY BODY (MISCELLANEOUS) ×3 IMPLANT
SUCT SIGMOIDOSCOPE TIP 18 W/TU (SUCTIONS) ×1 IMPLANT
SURGILUBE 2OZ TUBE FLIPTOP (MISCELLANEOUS) ×3 IMPLANT
SUT CHROMIC 3 0 SH 27 (SUTURE) ×1 IMPLANT
SUT ETHILON 3-0 FS-10 30 BLK (SUTURE) ×3
SUT MON AB 3-0 SH 27 (SUTURE) ×2 IMPLANT
SUT SILK 0 CT 1 30 (SUTURE) ×3 IMPLANT
SUT VIC AB 3-0 SH 27 (SUTURE)
SUT VIC AB 3-0 SH 27X BRD (SUTURE) ×1 IMPLANT
SUTURE EHLN 3-0 FS-10 30 BLK (SUTURE) IMPLANT
SYR CONTROL 10ML (SYRINGE) IMPLANT
TUBING SMOKE EVAC 6FT (TUBING) IMPLANT

## 2017-06-03 NOTE — Anesthesia Procedure Notes (Signed)
Procedure Name: LMA Insertion Date/Time: 06/03/2017 8:29 AM Performed by: Justus Memory, CRNA Pre-anesthesia Checklist: Patient identified, Patient being monitored, Timeout performed, Emergency Drugs available and Suction available Patient Re-evaluated:Patient Re-evaluated prior to induction Oxygen Delivery Method: Circle system utilized Preoxygenation: Pre-oxygenation with 100% oxygen Induction Type: IV induction Ventilation: Mask ventilation without difficulty LMA: LMA inserted LMA Size: 4.5 Tube type: Oral Number of attempts: 1 Placement Confirmation: positive ETCO2 and breath sounds checked- equal and bilateral Tube secured with: Tape Dental Injury: Teeth and Oropharynx as per pre-operative assessment

## 2017-06-03 NOTE — Op Note (Signed)
Preoperative diagnosis: Low rectal polyp.  Postoperative diagnosis: Same with internal hemorrhoid.  Operative procedure: Exam under anesthesia, transanal excision of a internal hemorrhoid and low rectal polyp at the 11 o'clock position.  Operating Surgeon: Hervey Ard, MD.  Anesthesia: General by LMA, Marcaine 0.5% with 1-200,000 units of epinephrine.  Estimated blood loss: 10 cc.  Clinical note: This 56 year old male underwent a colonoscopy earlier this year at which time a polyp at the dentate line was identified.  Biopsy showed tubulovillous changes.  He was felt to be a candidate for transanal excision.  The patient received Invanz prior to the procedure.  Operative note: The patient underwent general anesthesia was placed in dorsal lithotomy position.  The perineum was cleansed with Betadine and draped.  20 cc of 0.5% Marcaine with 1-200,000 units of epinephrine was used as a block on the perianal skin.  Anoscopy was completed and showed the previously identified polyp and at this time and associated 1+ centimeter internal hemorrhoid at the 10-11 o'clock position.  10 cc of the above-mentioned local anesthetic was injected in between the mucosa and the muscular layer with prompt elevation of the polyp suggesting no invasion into the muscle.  It was elected to resect the internal hemorrhoid as it was blocking a good view of the 1.5 cm polyp that is at the lower level of the rectum.  The area 4 mm around the polypoid mass was marked with cautery and then the harmonic scalpel was used to remove the area.  This was laid on a piece of Telfa gauze stretched and orientated.  This was sent in formalin for routine histology.  Good hemostasis was noted.  The mucosal defect was closed with 3-0 Monocryl figure-of-eight sutures x2.  A Vaseline gauze pack was placed.  Dry dressing to the perineum and the patient taken to the recovery room in stable condition.

## 2017-06-03 NOTE — Discharge Instructions (Signed)

## 2017-06-03 NOTE — Anesthesia Post-op Follow-up Note (Signed)
Anesthesia QCDR form completed.        

## 2017-06-03 NOTE — Anesthesia Preprocedure Evaluation (Signed)
Anesthesia Evaluation  Patient identified by MRN, date of birth, ID band Patient awake    Reviewed: Allergy & Precautions, H&P , NPO status , Patient's Chart, lab work & pertinent test results  History of Anesthesia Complications Negative for: history of anesthetic complications  Airway Mallampati: III  TM Distance: <3 FB Neck ROM: full    Dental  (+) Chipped, Poor Dentition   Pulmonary neg shortness of breath, sleep apnea and Continuous Positive Airway Pressure Ventilation , pneumonia,           Cardiovascular Exercise Tolerance: Good hypertension, (-) angina(-) Past MI and (-) DOE      Neuro/Psych PSYCHIATRIC DISORDERS Depression negative neurological ROS     GI/Hepatic Neg liver ROS, GERD  Medicated and Controlled,  Endo/Other  negative endocrine ROS  Renal/GU      Musculoskeletal   Abdominal   Peds  Hematology negative hematology ROS (+)   Anesthesia Other Findings Past Medical History: No date: Adenomatous polyp of ascending colon No date: Depression     Comment:  H/O NO MEDS No date: GERD (gastroesophageal reflux disease) No date: Hypertension 1994: Pneumonia No date: Sleep apnea     Comment:  wears CPAP  Past Surgical History: No date: COLONOSCOPY 05/04/2017: COLONOSCOPY WITH PROPOFOL; N/A     Comment:  Procedure: COLONOSCOPY WITH PROPOFOL;  Surgeon: Laurence Spates, MD;  Location: [facility];  Service: Endoscopy;               Laterality: N/A; 05/04/2017: HOT HEMOSTASIS; N/A     Comment:  Procedure: HOT HEMOSTASIS (ARGON PLASMA               COAGULATION/BICAP);  Surgeon: Laurence Spates, MD;                Location: [facility];  Service: Endoscopy;  Laterality:              N/A; No date: KNEE ARTHROSCOPY W/ ACL RECONSTRUCTION     Comment:  left 05/20/2017: SINUS SURGERY WITH INSTATRAK     Comment:  @ [facility] WITH MADISION [facility]ARK No date: WISDOM TOOTH EXTRACTION  BMI    Body Mass  Index:  31.28 kg/m      Reproductive/Obstetrics negative OB ROS                             Anesthesia Physical Anesthesia Plan  ASA: III  Anesthesia Plan: General   Post-op Pain Management:    Induction: Intravenous  PONV Risk Score and Plan: 2  Airway Management Planned: LMA  Additional Equipment:   Intra-op Plan:   Post-operative Plan: Extubation in OR  Informed Consent: I have reviewed the patients History and Physical, chart, labs and discussed the procedure including the risks, benefits and alternatives for the proposed anesthesia with the patient or authorized representative who has indicated his/her understanding and acceptance.   Dental Advisory Given  Plan Discussed with: Anesthesiologist, CRNA and Surgeon  Anesthesia Plan Comments: (Patient consented for risks of anesthesia including but not limited to:  - adverse reactions to medications - damage to teeth, lips or other oral mucosa - sore throat or hoarseness - Damage to heart, brain, lungs or loss of life  Patient voiced understanding.)        Anesthesia Quick Evaluation

## 2017-06-03 NOTE — Transfer of Care (Signed)
Immediate Anesthesia Transfer of Care Note  Patient: Zachary Chung  Procedure(s) Performed: TRANSANAL EXCISION OF RECTAL POLYP (N/A )  Patient Location: PACU  Anesthesia Type:General  Level of Consciousness: sedated  Airway & Oxygen Therapy: Patient Spontanous Breathing  Post-op Assessment: Report given to RN and Post -op Vital signs reviewed and stable  Post vital signs: Reviewed and stable  Last Vitals:  Vitals:   06/03/17 0618 06/03/17 0832  BP: (!) 155/113 125/79  Pulse: 79 78  Resp: 20 18  Temp: 36.4 C 36.6 C  SpO2: 100% 98%    Last Pain:  Vitals:   06/03/17 0832  TempSrc:   PainSc: 0-No pain         Complications: No apparent anesthesia complications

## 2017-06-03 NOTE — H&P (Signed)
No change in history or exam

## 2017-06-03 NOTE — Anesthesia Postprocedure Evaluation (Signed)
Anesthesia Post Note  Patient: Zachary Chung  Procedure(s) Performed: TRANSANAL EXCISION OF RECTAL POLYP (N/A )  Patient location during evaluation: PACU Anesthesia Type: General Level of consciousness: awake and alert Pain management: pain level controlled Vital Signs Assessment: post-procedure vital signs reviewed and stable Respiratory status: spontaneous breathing, nonlabored ventilation, respiratory function stable and patient connected to nasal cannula oxygen Cardiovascular status: blood pressure returned to baseline and stable Postop Assessment: no apparent nausea or vomiting Anesthetic complications: no     Last Vitals:  Vitals:   06/03/17 0915 06/03/17 0929  BP: (!) 130/92 (!) 141/100  Pulse: 65 60  Resp: 18 16  Temp: 36.6 C (!) 36.4 C  SpO2: 98% 99%    Last Pain:  Vitals:   06/03/17 0929  TempSrc: Temporal  PainSc:                  Zachary Chung

## 2017-06-07 LAB — SURGICAL PATHOLOGY

## 2017-06-22 ENCOUNTER — Ambulatory Visit (INDEPENDENT_AMBULATORY_CARE_PROVIDER_SITE_OTHER): Payer: 59 | Admitting: General Surgery

## 2017-06-22 ENCOUNTER — Encounter: Payer: Self-pay | Admitting: General Surgery

## 2017-06-22 VITALS — BP 154/82 | HR 61 | Resp 12 | Ht 76.0 in | Wt 251.0 lb

## 2017-06-22 DIAGNOSIS — K621 Rectal polyp: Secondary | ICD-10-CM

## 2017-06-22 NOTE — Patient Instructions (Signed)
Patient to return as needed. The patient is aware to call back for any questions or concerns. 

## 2017-06-22 NOTE — Progress Notes (Signed)
Patient ID: Zachary Chung, male   DOB: 1960-09-02, 56 y.o.   MRN: [id number]  Chief Complaint  Patient presents with  . Routine Chung Op    HPI Zachary Chung is a 56 y.o. male here today for his Chung op rectal polyp excision done on 06/03/2017. Patient states he is doing well. He had a small amount of bleeding for the first couple days after the transanal excision. Rare drop of blood on the toilet paper after bowel movements at this time.   HPI  Past Medical History:  Diagnosis Date  . Adenomatous polyp of ascending colon   . Depression    H/O NO MEDS  . GERD (gastroesophageal reflux disease)   . Hypertension   . Pneumonia 1994  . Sleep apnea    wears CPAP    Past Surgical History:  Procedure Laterality Date  . COLONOSCOPY    . COLONOSCOPY WITH PROPOFOL N/A 05/04/2017   Procedure: COLONOSCOPY WITH PROPOFOL;  Surgeon: Laurence Spates, MD;  Location: [facility];  Service: Endoscopy;  Laterality: N/A;  . HOT HEMOSTASIS N/A 05/04/2017   Procedure: HOT HEMOSTASIS (ARGON PLASMA COAGULATION/BICAP);  Surgeon: Laurence Spates, MD;  Location: [facility];  Service: Endoscopy;  Laterality: N/A;  . KNEE ARTHROSCOPY W/ ACL RECONSTRUCTION     left  . SINUS SURGERY WITH INSTATRAK  05/20/2017   @ [place]  . TRANSANAL EXCISION OF RECTAL MASS N/A 06/03/2017   Procedure: TRANSANAL EXCISION OF RECTAL POLYP;  Surgeon: [name], MD;  Location: [facility];  Service: General;  Laterality: N/A;  . WISDOM TOOTH EXTRACTION      Family History  Problem Relation Age of Onset  . Parkinson's disease Mother   . Colon polyps Mother   . Heart disease Father   . Colon cancer Maternal Uncle 3    Social History Social History   Tobacco Use  . Smoking status: Never Smoker  . Smokeless tobacco: Never Used  Substance Use Topics  . Alcohol use: Yes    Comment: 1-2 beers 4 days /week  . Drug use: No    Allergies  Allergen Reactions  . Tetracyclines & Related Rash     Current Outpatient Medications  Medication Sig Dispense Refill  . amLODipine (NORVASC) 5 MG tablet Take 5 mg daily by mouth.    . fluticasone (FLONASE) 50 MCG/ACT nasal spray Place 1 spray into both nostrils daily as needed for allergies.  12  . HYDROcodone-acetaminophen (NORCO) 5-325 MG tablet Take 1-2 tablets every 4 (four) hours as needed by mouth. Take no more than 10 tablets daily. 15 tablet 0  . Melatonin 10 MG CAPS Take by mouth as needed.    . pantoprazole (PROTONIX) 40 MG tablet Take 40 mg every morning by mouth.     . ranitidine (ZANTAC) 150 MG tablet Take 150 mg by mouth every evening.    . temazepam (RESTORIL) 30 MG capsule Take 30 mg by mouth at bedtime as needed for sleep.     No current facility-administered medications for this visit.     Review of Systems Review of Systems  Blood pressure (!) 154/82, pulse 61, resp. rate 12, height 6\' 4"  (1.93 m), weight 251 lb (113.9 kg).  Physical Exam Physical Exam  Constitutional: He is oriented to person, place, and time. He appears well-developed and well-nourished.  Neurological: He is alert and oriented to person, place, and time.  Skin: Skin is warm and dry.  Anoscopy showed the wound is healing  well with perhaps a 5 x 12 mm residual base of granulation tissue.  Data Reviewed 05/04/2017 colonoscopy biopsy results: 1. Colon, polyp(s), descending - TUBULAR ADENOMA(X1). - HIGH GRADE DYSPLASIA IS NOT IDENTIFIED. 2. Rectum, polyp(s), distal rectal polypoid mass - TUBULOVILLOUS ADENOMA(S). - HIGH GRADE DYSPLASIA IS NOT IDENTIFIED.   06/03/2017 transanal excision results: DIAGNOSIS:  A. RECTUM POLYP, RIGHT ANTERIOR; MUCOSAL RESECTION:  - TUBULAR ADENOMA MEASURING 1.1 CM.  - MUCOSAL MARGINS ARE NEGATIVE FOR DYSPLASIA AND MALIGNANCY.  - SUBMUCOSAL VENOUS CONGESTION.  - NEGATIVE FOR HIGH-GRADE DYSPLASIA AND MALIGNANCY.   The colonoscopy images were reviewed and the polyp resected trans-anally corresponds with the lesion  identified on endoscopy. Of note tubulovillous changes were noted on the biopsy specimens, not confirmed on formal excision.  Assessment    Doing well Chung transanal excision of a lower rectal tubular and known.    Plan    The patient had 2, tubular adenomas at the time of his recent colonoscopy. Normal follow-up would be at 5 years. Patient will follow-up with his endosopist as previously scheduled.    Patient to return as needed. The patient is aware to call back for any questions or concerns.  HPI, Physical Exam, Assessment and Plan have been scribed under the direction and in the presence of Hervey Ard, MD.  Gaspar Cola, CMA  I have completed the exam and reviewed the above documentation for accuracy and completeness.  I agree with the above.  Haematologist has been used and any errors in dictation or transcription are unintentional.  Hervey Ard, M.D., F.A.C.S.   [name] 06/22/2017, 9:26 PM

## 2017-07-02 DIAGNOSIS — F4321 Adjustment disorder with depressed mood: Secondary | ICD-10-CM | POA: Diagnosis not present

## 2017-09-03 DIAGNOSIS — G4733 Obstructive sleep apnea (adult) (pediatric): Secondary | ICD-10-CM | POA: Diagnosis not present

## 2017-09-10 DIAGNOSIS — I10 Essential (primary) hypertension: Secondary | ICD-10-CM | POA: Diagnosis not present

## 2017-09-10 DIAGNOSIS — M5134 Other intervertebral disc degeneration, thoracic region: Secondary | ICD-10-CM | POA: Diagnosis not present

## 2017-09-10 DIAGNOSIS — F4321 Adjustment disorder with depressed mood: Secondary | ICD-10-CM | POA: Diagnosis not present

## 2017-09-10 DIAGNOSIS — F900 Attention-deficit hyperactivity disorder, predominantly inattentive type: Secondary | ICD-10-CM | POA: Diagnosis not present

## 2017-10-22 DIAGNOSIS — F4321 Adjustment disorder with depressed mood: Secondary | ICD-10-CM | POA: Diagnosis not present

## 2017-12-31 DIAGNOSIS — Z0000 Encounter for general adult medical examination without abnormal findings: Secondary | ICD-10-CM | POA: Diagnosis not present

## 2018-01-28 DIAGNOSIS — F4321 Adjustment disorder with depressed mood: Secondary | ICD-10-CM | POA: Diagnosis not present

## 2018-03-03 DIAGNOSIS — G4733 Obstructive sleep apnea (adult) (pediatric): Secondary | ICD-10-CM | POA: Diagnosis not present

## 2018-06-03 DIAGNOSIS — F4321 Adjustment disorder with depressed mood: Secondary | ICD-10-CM | POA: Diagnosis not present

## 2018-06-10 DIAGNOSIS — F4321 Adjustment disorder with depressed mood: Secondary | ICD-10-CM | POA: Diagnosis not present

## 2018-07-01 DIAGNOSIS — G589 Mononeuropathy, unspecified: Secondary | ICD-10-CM | POA: Diagnosis not present

## 2018-07-01 DIAGNOSIS — R531 Weakness: Secondary | ICD-10-CM | POA: Diagnosis not present

## 2018-07-07 DIAGNOSIS — G589 Mononeuropathy, unspecified: Secondary | ICD-10-CM | POA: Diagnosis not present

## 2018-07-07 DIAGNOSIS — R5383 Other fatigue: Secondary | ICD-10-CM | POA: Diagnosis not present

## 2018-07-22 DIAGNOSIS — F4321 Adjustment disorder with depressed mood: Secondary | ICD-10-CM | POA: Diagnosis not present

## 2018-09-02 ENCOUNTER — Other Ambulatory Visit: Payer: Self-pay | Admitting: Gastroenterology

## 2018-09-02 ENCOUNTER — Ambulatory Visit (INDEPENDENT_AMBULATORY_CARE_PROVIDER_SITE_OTHER): Payer: Self-pay | Admitting: Urology

## 2018-09-02 ENCOUNTER — Encounter: Payer: Self-pay | Admitting: Urology

## 2018-09-02 VITALS — BP 142/108 | HR 70 | Ht 76.0 in | Wt 259.4 lb

## 2018-09-02 DIAGNOSIS — K210 Gastro-esophageal reflux disease with esophagitis: Secondary | ICD-10-CM | POA: Diagnosis not present

## 2018-09-02 DIAGNOSIS — R197 Diarrhea, unspecified: Secondary | ICD-10-CM | POA: Diagnosis not present

## 2018-09-02 DIAGNOSIS — N5201 Erectile dysfunction due to arterial insufficiency: Secondary | ICD-10-CM

## 2018-09-02 DIAGNOSIS — Z86010 Personal history of colonic polyps: Secondary | ICD-10-CM | POA: Diagnosis not present

## 2018-09-02 MED ORDER — SILDENAFIL CITRATE 20 MG PO TABS: ORAL_TABLET | ORAL | 0 refills | Status: DC

## 2018-09-02 MED ORDER — TADALAFIL 20 MG PO TABS: ORAL_TABLET | ORAL | 0 refills | Status: DC

## 2018-09-02 MED ORDER — CLOMIPHENE CITRATE 50 MG PO TABS: 25.0000 mg | ORAL_TABLET | Freq: Every day | ORAL | 1 refills | Status: DC

## 2018-09-02 NOTE — Progress Notes (Signed)
09/02/2018 8:47 AM   Zachary Chung 1960/10/28 [id number]  Referring provider: Albina Billet, MD [address]   [place], [place] [address]  Chief Complaint  Patient presents with  . Erectile Dysfunction    HPI: 58 year old male presents today for evaluation of erectile dysfunction.  He presents with an approximately 96-month history of difficulty achieving and maintaining an erection.  He typically is unable to have satisfactory intercourse because he loses the erection.  He has been using sildenafil 100 mg and states last week was the first time he has had successful intercourse in 2 months.  He has a history of low testosterone but has never been on replacement.  A total testosterone level drawn in December 2019 was 387 ng/dL and a free testosterone was 9.3 pg/mL.  He denies pain or curvature with erection.  Organic risk factors include hypertension and antihypertensive medications.   PMH: Past Medical History:  Diagnosis Date  . Adenomatous polyp of ascending colon   . Depression    H/O NO MEDS  . GERD (gastroesophageal reflux disease)   . Hypertension   . Pneumonia 1994  . Sleep apnea    wears CPAP    Surgical History: Past Surgical History:  Procedure Laterality Date  . COLONOSCOPY    . COLONOSCOPY WITH PROPOFOL N/A 05/04/2017   Procedure: COLONOSCOPY WITH PROPOFOL;  Surgeon: Laurence Spates, MD;  Location: [facility];  Service: Endoscopy;  Laterality: N/A;  . HOT HEMOSTASIS N/A 05/04/2017   Procedure: HOT HEMOSTASIS (ARGON PLASMA COAGULATION/BICAP);  Surgeon: Laurence Spates, MD;  Location: [facility];  Service: Endoscopy;  Laterality: N/A;  . KNEE ARTHROSCOPY W/ ACL RECONSTRUCTION     left  . SINUS SURGERY WITH INSTATRAK  05/20/2017   @ [place]  . TRANSANAL EXCISION OF RECTAL MASS N/A 06/03/2017   Procedure: TRANSANAL EXCISION OF RECTAL POLYP;  Surgeon: [name], MD;  Location: [facility];  Service: General;  Laterality: N/A;  .  WISDOM TOOTH EXTRACTION      Home Medications:  Allergies as of 09/02/2018      Reactions   Tetracyclines & Related Rash      Medication List       Accurate as of September 02, 2018  8:47 AM. Always use your most recent med list.        ALPRAZolam 1 MG tablet Commonly known as:  XANAX   amLODipine 5 MG tablet Commonly known as:  NORVASC Take 5 mg daily by mouth.   buPROPion 300 MG 24 hr tablet Commonly known as:  WELLBUTRIN XL   fluticasone 50 MCG/ACT nasal spray Commonly known as:  FLONASE Place 1 spray into both nostrils daily as needed for allergies.   pantoprazole 40 MG tablet Commonly known as:  PROTONIX Take 40 mg every morning by mouth.       Allergies:  Allergies  Allergen Reactions  . Tetracyclines & Related Rash    Family History: Family History  Problem Relation Age of Onset  . Parkinson's disease Mother   . Colon polyps Mother   . Heart disease Father   . Colon cancer Maternal Uncle 36    Social History:  reports that he has never smoked. He has never used smokeless tobacco. He reports current alcohol use. He reports that he does not use drugs.  ROS: UROLOGY Frequent Urination?: No Hard to postpone urination?: No Burning/pain with urination?: No Get up at night to urinate?: Yes Leakage of urine?: No Urine  stream starts and stops?: No Trouble starting stream?: No Do you have to strain to urinate?: No Blood in urine?: No Urinary tract infection?: No Sexually transmitted disease?: No Injury to kidneys or bladder?: No Painful intercourse?: No Weak stream?: No Erection problems?: Yes Penile pain?: No  Gastrointestinal Nausea?: No Vomiting?: No Indigestion/heartburn?: No Diarrhea?: Yes Constipation?: No  Constitutional Fever: No Night sweats?: No Weight loss?: No Fatigue?: Yes  Skin Skin rash/lesions?: No Itching?: No  Eyes Blurred vision?: No Double vision?: No  Ears/Nose/Throat Sore throat?: No Sinus problems?:  No  Hematologic/Lymphatic Swollen glands?: No Easy bruising?: No  Cardiovascular Leg swelling?: No Chest pain?: No  Respiratory Cough?: No Shortness of breath?: No  Endocrine Excessive thirst?: No  Musculoskeletal Back pain?: No Joint pain?: No  Neurological Headaches?: No Dizziness?: No  Psychologic Depression?: Yes Anxiety?: Yes  Physical Exam: BP (!) 142/108 (BP Location: Left Arm, Patient Position: Sitting, Cuff Size: Large)   Pulse 70   Ht 6\' 4"  (1.93 m)   Wt 259 lb 6.4 oz (117.7 kg)   BMI 31.58 kg/m   Constitutional:  Alert and oriented, No acute distress. HEENT: [place] AT, moist mucus membranes.  Trachea midline, no masses. Cardiovascular: No clubbing, cyanosis, or edema. Respiratory: Normal respiratory effort, no increased work of breathing. GU: Penis circumcised without lesions, testes descended bilaterally without masses or tenderness with estimated volume >20 cc bilaterally Skin: No rashes, bruises or suspicious lesions. Neurologic: Grossly intact, no focal deficits, moving all 4 extremities. Psychiatric: Normal mood and affect.   Assessment & Plan:   58 year old male with erectile dysfunction.  He does have difficulty maintaining an erection and we discussed possibility of venous leak.  He was given information on a venous compression band which may improve success with a PDE 5 inhibitor.  His free and total testosterone levels are low normal.  I discussed a trial of clomiphene for 2 to 3 months which may improve his ED or at least make a PDE 5 inhibitor more effective.  It may also help his energy level.  He was interested and trying an Rx was sent to pharmacy.  He was also interested in a trial of generic tadalafil and Rx was sent.  Follow-up free/total testosterone level in 2 to 3 months.  Abbie Sons, [place] [address], [place], [place] [address] [phone]

## 2018-09-04 ENCOUNTER — Encounter: Payer: Self-pay | Admitting: Urology

## 2018-09-05 DIAGNOSIS — G4733 Obstructive sleep apnea (adult) (pediatric): Secondary | ICD-10-CM | POA: Diagnosis not present

## 2018-09-07 ENCOUNTER — Encounter: Payer: Self-pay | Admitting: Urology

## 2018-09-07 ENCOUNTER — Other Ambulatory Visit: Payer: Self-pay | Admitting: Urology

## 2018-09-07 MED ORDER — SILDENAFIL CITRATE 20 MG PO TABS: ORAL_TABLET | ORAL | 0 refills | Status: DC

## 2018-09-09 DIAGNOSIS — F4321 Adjustment disorder with depressed mood: Secondary | ICD-10-CM | POA: Diagnosis not present

## 2018-10-14 ENCOUNTER — Ambulatory Visit (HOSPITAL_COMMUNITY): Admission: RE | Admit: 2018-10-14 | Payer: 59 | Source: Ambulatory Visit | Admitting: Gastroenterology

## 2018-10-14 ENCOUNTER — Encounter (HOSPITAL_COMMUNITY): Admission: RE | Payer: Self-pay | Source: Ambulatory Visit

## 2018-10-14 SURGERY — COLONOSCOPY WITH PROPOFOL
Anesthesia: Monitor Anesthesia Care

## 2018-12-01 ENCOUNTER — Other Ambulatory Visit: Payer: Self-pay | Admitting: Urology

## 2018-12-01 MED ORDER — TADALAFIL 20 MG PO TABS: ORAL_TABLET | ORAL | 3 refills | Status: DC

## 2019-01-11 ENCOUNTER — Other Ambulatory Visit: Payer: Self-pay | Admitting: Gastroenterology

## 2019-01-13 ENCOUNTER — Other Ambulatory Visit: Payer: Self-pay | Admitting: Gastroenterology

## 2019-01-23 ENCOUNTER — Other Ambulatory Visit (HOSPITAL_COMMUNITY)
Admission: RE | Admit: 2019-01-23 | Discharge: 2019-01-23 | Disposition: A | Discharge status: Home / Self Care | Payer: 59 | Source: Ambulatory Visit | Attending: Gastroenterology | Admitting: Gastroenterology

## 2019-01-23 ENCOUNTER — Encounter (HOSPITAL_COMMUNITY): Payer: Self-pay

## 2019-01-23 DIAGNOSIS — Z01812 Encounter for preprocedural laboratory examination: Secondary | ICD-10-CM | POA: Insufficient documentation

## 2019-01-23 DIAGNOSIS — Z1159 Encounter for screening for other viral diseases: Secondary | ICD-10-CM | POA: Insufficient documentation

## 2019-01-23 LAB — SARS CORONAVIRUS 2 (TAT 6-24 HRS): SARS Coronavirus 2: NEGATIVE

## 2019-01-23 NOTE — Progress Notes (Signed)
SPOKE W/  Robert     SCREENING SYMPTOMS OF COVID 19:   COUGH--NO  RUNNY NOSE--- NO  SORE THROAT---NO  NASAL CONGESTION----NO  SNEEZING----NO  SHORTNESS OF BREATH---NO  DIFFICULTY BREATHING---NO  TEMP >100.0 -----NO  UNEXPLAINED BODY ACHES------NO  CHILLS -------- NO  HEADACHES ---------NO  LOSS OF SMELL/ TASTE --------NO    HAVE YOU OR ANY FAMILY MEMBER TRAVELLED PAST 14 DAYS OUT OF THE   COUNTY--LIVES IN [place] STATE----NO COUNTRY----NO  HAVE YOU OR ANY FAMILY MEMBER BEEN EXPOSED TO ANYONE WITH COVID 19? NO

## 2019-01-26 ENCOUNTER — Ambulatory Visit (HOSPITAL_COMMUNITY)
Admission: RE | Admit: 2019-01-26 | Discharge: 2019-01-26 | Disposition: A | Discharge status: Home / Self Care | Payer: 59 | Source: Ambulatory Visit | Attending: Gastroenterology | Admitting: Gastroenterology

## 2019-01-26 ENCOUNTER — Ambulatory Visit (HOSPITAL_COMMUNITY): Payer: 59 | Admitting: Anesthesiology

## 2019-01-26 ENCOUNTER — Encounter (HOSPITAL_COMMUNITY)
Admission: RE | Disposition: A | Discharge status: Home / Self Care | Payer: Self-pay | Source: Ambulatory Visit | Attending: Gastroenterology

## 2019-01-26 ENCOUNTER — Other Ambulatory Visit: Payer: Self-pay

## 2019-01-26 ENCOUNTER — Encounter (HOSPITAL_COMMUNITY): Payer: Self-pay | Admitting: Anesthesiology

## 2019-01-26 DIAGNOSIS — K6389 Other specified diseases of intestine: Secondary | ICD-10-CM | POA: Diagnosis not present

## 2019-01-26 DIAGNOSIS — K529 Noninfective gastroenteritis and colitis, unspecified: Secondary | ICD-10-CM | POA: Diagnosis not present

## 2019-01-26 DIAGNOSIS — N529 Male erectile dysfunction, unspecified: Secondary | ICD-10-CM | POA: Diagnosis not present

## 2019-01-26 DIAGNOSIS — K219 Gastro-esophageal reflux disease without esophagitis: Secondary | ICD-10-CM | POA: Insufficient documentation

## 2019-01-26 DIAGNOSIS — Z791 Long term (current) use of non-steroidal anti-inflammatories (NSAID): Secondary | ICD-10-CM | POA: Diagnosis not present

## 2019-01-26 DIAGNOSIS — Z8719 Personal history of other diseases of the digestive system: Secondary | ICD-10-CM | POA: Diagnosis not present

## 2019-01-26 DIAGNOSIS — Z8249 Family history of ischemic heart disease and other diseases of the circulatory system: Secondary | ICD-10-CM | POA: Diagnosis not present

## 2019-01-26 DIAGNOSIS — G4730 Sleep apnea, unspecified: Secondary | ICD-10-CM | POA: Insufficient documentation

## 2019-01-26 DIAGNOSIS — Q438 Other specified congenital malformations of intestine: Secondary | ICD-10-CM | POA: Diagnosis not present

## 2019-01-26 DIAGNOSIS — Z86010 Personal history of colonic polyps: Secondary | ICD-10-CM | POA: Diagnosis not present

## 2019-01-26 DIAGNOSIS — Z79899 Other long term (current) drug therapy: Secondary | ICD-10-CM | POA: Diagnosis not present

## 2019-01-26 DIAGNOSIS — K635 Polyp of colon: Secondary | ICD-10-CM | POA: Insufficient documentation

## 2019-01-26 DIAGNOSIS — Z888 Allergy status to other drugs, medicaments and biological substances status: Secondary | ICD-10-CM | POA: Diagnosis not present

## 2019-01-26 DIAGNOSIS — D125 Benign neoplasm of sigmoid colon: Secondary | ICD-10-CM | POA: Diagnosis not present

## 2019-01-26 DIAGNOSIS — I10 Essential (primary) hypertension: Secondary | ICD-10-CM | POA: Diagnosis not present

## 2019-01-26 HISTORY — PX: COLONOSCOPY WITH PROPOFOL: SHX5780

## 2019-01-26 HISTORY — DX: Presence of spectacles and contact lenses: Z97.3

## 2019-01-26 HISTORY — PX: POLYPECTOMY: SHX5525

## 2019-01-26 HISTORY — DX: Male erectile dysfunction, unspecified: N52.9

## 2019-01-26 HISTORY — PX: BIOPSY: SHX5522

## 2019-01-26 SURGERY — COLONOSCOPY WITH PROPOFOL
Anesthesia: Monitor Anesthesia Care

## 2019-01-26 MED ORDER — PROPOFOL 10 MG/ML IV BOLUS
INTRAVENOUS | Status: AC
  Filled 2019-01-26: qty 20

## 2019-01-26 MED ORDER — PROPOFOL 500 MG/50ML IV EMUL
INTRAVENOUS | Status: DC | PRN
  Administered 2019-01-26: 200 ug/kg/min via INTRAVENOUS

## 2019-01-26 MED ORDER — PROPOFOL 10 MG/ML IV BOLUS
INTRAVENOUS | Status: AC
  Filled 2019-01-26: qty 40

## 2019-01-26 MED ORDER — LACTATED RINGERS IV SOLN
INTRAVENOUS | Status: DC
  Administered 2019-01-26: 08:00:00 1000 mL via INTRAVENOUS
  Administered 2019-01-26: 09:00:00 via INTRAVENOUS

## 2019-01-26 MED ORDER — LIDOCAINE HCL 1 % IJ SOLN
INTRAMUSCULAR | Status: DC | PRN
  Administered 2019-01-26: 40 mg via INTRADERMAL

## 2019-01-26 MED ORDER — SODIUM CHLORIDE 0.9 % IV SOLN: INTRAVENOUS | Status: DC

## 2019-01-26 SURGICAL SUPPLY — 22 items

## 2019-01-26 NOTE — Anesthesia Preprocedure Evaluation (Signed)
Anesthesia Evaluation    Airway Mallampati: II  TM Distance: >3 FB Neck ROM: Full    Dental no notable dental hx.    Pulmonary sleep apnea and Continuous Positive Airway Pressure Ventilation ,    Pulmonary exam normal breath sounds clear to auscultation       Cardiovascular hypertension, Normal cardiovascular exam Rhythm:Regular Rate:Normal     Neuro/Psych    GI/Hepatic GERD  ,  Endo/Other    Renal/GU      Musculoskeletal   Abdominal   Peds  Hematology   Anesthesia Other Findings   Reproductive/Obstetrics                             Anesthesia Physical Anesthesia Plan  ASA: III  Anesthesia Plan: MAC   Post-op Pain Management:    Induction: Intravenous  PONV Risk Score and Plan: 0  Airway Management Planned: Simple Face Mask  Additional Equipment:   Intra-op Plan:   Post-operative Plan:   Informed Consent: I have reviewed the patients History and Physical, chart, labs and discussed the procedure including the risks, benefits and alternatives for the proposed anesthesia with the patient or authorized representative who has indicated his/her understanding and acceptance.     Dental advisory given  Plan Discussed with: CRNA and Surgeon  Anesthesia Plan Comments:         Anesthesia Quick Evaluation

## 2019-01-26 NOTE — Op Note (Signed)
[facility] Patient Name: Zachary Chung Procedure Date: 01/26/2019 MRN: [id number] Attending MD: Nancy Fetter Dr., MD Date of Birth: Nov 10, 1960 CSN: [id number] Age: 58 Admit Type: Outpatient Procedure:                Colonoscopy Indications:              History of colon polyps last colonoscopy 2018                            multiple adenomas removed. He also had adenoma                            removed from his distal rectum surgically extending                            into the anal canal. Has had chronic diarrhea needs                            to be evaluated for microscopic colitis. Providers:                Joyice Faster. Tenia Goh Dr., MD, Cleda Daub, RN, Marguerita Merles, Technician Referring MD:             Dr Benita Stabile Medicines:                 Complications:            No immediate complications. Estimated Blood Loss:     Estimated blood loss: none. Estimated blood loss                            was minimal. Procedure:                Pre-Anesthesia Assessment:                           - Prior to the procedure, a History and Physical                            was performed, and patient medications and                            allergies were reviewed. The patient's tolerance of                            previous anesthesia was also reviewed. The risks                            and benefits of the procedure and the sedation                            options and risks were discussed with the patient.  All questions were answered, and informed consent                            was obtained. Prior Anticoagulants: The patient has                            taken no previous anticoagulant or antiplatelet                            agents. ASA Grade Assessment: III - A patient with                            severe systemic disease. After reviewing the risks                            and benefits,  the patient was deemed in                            satisfactory condition to undergo the procedure.                           After obtaining informed consent, the colonoscope                            was passed under direct vision. Throughout the                            procedure, the patient's blood pressure, pulse, and                            oxygen saturations were monitored continuously. The                            CF-HQ190L ([id number] Olympus colonoscope was                            introduced through the anus and advanced to the the                            cecum, identified by appendiceal orifice and                            ileocecal valve. The colonoscopy was unusually                            difficult due to a redundant colon, significant                            looping and a tortuous colon. Successful completion                            of the procedure was aided by changing the patient  to a supine position and applying abdominal                            pressure. The patient was also placed on his right                            side and then back to the left side. Scope In: 8:34:21 AM Scope Out: 9:30:16 AM Scope Withdrawal Time: 0 hours 39 minutes 11 seconds  Total Procedure Duration: 0 hours 55 minutes 55 seconds  Findings:      The perianal and digital rectal examinations were normal.      Normal mucosa was found in the entire colon. Biopsies for histology were       taken with a cold forceps from the entire colon for evaluation of       microscopic colitis. The pathology specimen was placed into Bottle       Number 1.      A 5 mm polyp was found in the sigmoid colon. The polyp was sessile. The       polyp was removed with a hot snare. Resection and retrieval were       complete. The pathology specimen was placed into Bottle Number 2.      The retroflexed view of the distal rectum and anal verge was normal and        showed no anal or rectal abnormalities. Impression:               - Normal mucosa in the entire examined colon.                            Biopsied.                           - One 5 mm polyp in the sigmoid colon, removed with                            a hot snare. Resected and retrieved.                           - The distal rectum and anal verge are normal on                            retroflexion view.                           - Personal history of colonic polyps.                           - Diarrhea without obvious etiology on colonoscopy Moderate Sedation:      See anesthesia note, no moderate sedation. Recommendation:           - Patient has a contact number available for                            emergencies. The signs and symptoms of potential  delayed complications were discussed with the                            patient. Return to normal activities tomorrow.                            Written discharge instructions were provided to the                            patient.                           - Resume previous diet.                           - Continue present medications.                           - No aspirin, ibuprofen, naproxen, or other                            non-steroidal anti-inflammatory drugs for 5 days                            after polyp removal.                           - Repeat colonoscopy for surveillance based on                            pathology results. Procedure Code(s):        --- Professional ---                           [phone], Colonoscopy, flexible; with removal of                            tumor(s), polyp(s), or other lesion(s) by snare                            technique                           45380, 9, Colonoscopy, flexible; with biopsy,                            single or multiple Diagnosis Code(s):        --- Professional ---                           K63.5, Polyp of colon                            Z86.010, Personal history of colonic polyps CPT copyright 2019 [employer]. All rights reserved. The codes documented in this report are preliminary and upon coder review may  be revised to meet current compliance requirements. Nancy Fetter Dr., MD 01/26/2019 9:45:07 AM  This report has been signed electronically. Number of Addenda: 0

## 2019-01-26 NOTE — Discharge Instructions (Signed)
No aspirin, ibuprofen or other NSAID medications for 5 days. Office will send note or call when pathology results are obtained. Colonoscopy will be repeated based on the pathology results.

## 2019-01-26 NOTE — Anesthesia Postprocedure Evaluation (Signed)
Anesthesia Post Note  Patient: Zachary Chung  Procedure(s) Performed: COLONOSCOPY WITH PROPOFOL (N/A ) BIOPSY POLYPECTOMY     Patient location during evaluation: PACU Anesthesia Type: MAC Level of consciousness: awake and alert Pain management: pain level controlled Vital Signs Assessment: post-procedure vital signs reviewed and stable Respiratory status: spontaneous breathing, nonlabored ventilation, respiratory function stable and patient connected to nasal cannula oxygen Cardiovascular status: stable and blood pressure returned to baseline Postop Assessment: no apparent nausea or vomiting Anesthetic complications: no    Last Vitals:  Vitals:   01/26/19 0731 01/26/19 0938  BP: (!) 152/103 126/82  Pulse: 63 (!) 52  Resp: 16 15  Temp: 36.7 C 36.4 C  SpO2: 100% 100%    Last Pain:  Vitals:   01/26/19 0938  TempSrc: Oral  PainSc: 0-No pain                 Kishon Garriga S

## 2019-01-26 NOTE — Transfer of Care (Signed)
Immediate Anesthesia Transfer of Care Note  Patient: Zachary Chung  Procedure(s) Performed: COLONOSCOPY WITH PROPOFOL (N/A ) BIOPSY POLYPECTOMY  Patient Location: PACU and Endoscopy Unit  Anesthesia Type:MAC  Level of Consciousness: awake, alert , oriented and patient cooperative  Airway & Oxygen Therapy: Patient Spontanous Breathing and Patient connected to face mask oxygen  Post-op Assessment: Report given to RN and Post -op Vital signs reviewed and stable  Post vital signs: Reviewed and stable  Last Vitals:  Vitals Value Taken Time  BP 126/82 01/26/19 0940  Temp 36.4 C 01/26/19 0938  Pulse 55 01/26/19 0944  Resp 13 01/26/19 0944  SpO2 100 % 01/26/19 0944  Vitals shown include unvalidated device data.  Last Pain:  Vitals:   01/26/19 0938  TempSrc: Oral  PainSc: 0-No pain         Complications: No apparent anesthesia complications

## 2019-01-26 NOTE — H&P (Signed)
Subjective:   Patient is a 58 y.o. male presents with history of previous colon polyps.  He has been having loose stools as well.  1 of the colon polyps had to be removed surgically because it extended from the rectum down into the anal canal it was adenomatous.  Due to multiple polyps this is done as a follow-up.  He has been having chronic diarrhea and will plan on getting some colonic biopsies to evaluate for possible microscopic colitis as well.. Procedure including risks and benefits discussed in office.  Patient Active Problem List   Diagnosis Date Noted  . Rectal polyp 05/19/2017  . Hypertrophy of both inferior nasal turbinates 03/16/2017  . 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 Nasal congestion 03/16/2017   Past Medical History:  Diagnosis Date  . Adenomatous polyp of ascending colon   . Depression    H/O NO MEDS  . ED (erectile dysfunction)   . GERD (gastroesophageal reflux disease)   . Hypertension   . Pneumonia 1994  . Sleep apnea    wears BI-PAP  . Wears glasses     Past Surgical History:  Procedure Laterality Date  . COLONOSCOPY    . COLONOSCOPY WITH PROPOFOL N/A 05/04/2017   Procedure: COLONOSCOPY WITH PROPOFOL;  Surgeon: Laurence Spates, MD;  Location: [facility];  Service: Endoscopy;  Laterality: N/A;  . HOT HEMOSTASIS N/A 05/04/2017   Procedure: HOT HEMOSTASIS (ARGON PLASMA COAGULATION/BICAP);  Surgeon: Laurence Spates, MD;  Location: [facility];  Service: Endoscopy;  Laterality: N/A;  . KNEE ARTHROSCOPY W/ ACL RECONSTRUCTION     left  . SEPTOPLASTY    . SINUS SURGERY WITH INSTATRAK  05/20/2017   @ [place]  . TRANSANAL EXCISION OF RECTAL MASS N/A 06/03/2017   Procedure: TRANSANAL EXCISION OF RECTAL POLYP;  Surgeon: [name], MD;  Location: [facility] ORS;  Service: General;  Laterality: N/A;  . WISDOM TOOTH EXTRACTION      Medications Prior to Admission  Medication Sig Dispense Refill Last Dose  . acetaminophen (TYLENOL) 325 MG tablet Take  650 mg by mouth daily as needed for moderate pain or headache.   Past Week at Unknown time  . ALPRAZolam (XANAX) 1 MG tablet Take 1 mg by mouth at bedtime as needed for sleep.    01/25/2019 at Unknown time  . amLODipine (NORVASC) 5 MG tablet Take 5 mg by mouth every evening.    01/25/2019 at Unknown time  . diphenhydrAMINE (BENADRYL) 25 MG tablet Take 25 mg by mouth at bedtime as needed for sleep.   Past Week at Unknown time  . fluticasone (FLONASE) 50 MCG/ACT nasal spray Place 1 spray into both nostrils daily as needed for allergies.  12 Past Week at Unknown time  . naproxen (NAPROSYN) 500 MG tablet Take 500 mg by mouth 2 (two) times daily as needed for moderate pain.    Past Week at Unknown time  . tadalafil (ADCIRCA/CIALIS) 20 MG tablet 1 tab p.o. 30 to 60 minutes prior to intercourse (Patient taking differently: Take 20 mg by mouth daily as needed for erectile dysfunction. take 30 to 60 minutes prior to intercourse) 30 tablet 3 Past Week at Unknown time  . clomiPHENE (CLOMID) 50 MG tablet Take 0.5 tablets (25 mg total) by mouth daily. (Patient not taking: Reported on 01/17/2019) 30 tablet 1 Not Taking at Unknown time  . sildenafil (REVATIO) 20 MG tablet 2-5 tabs 1 hour prior to intercourse (Patient not taking: Reported on 10/05/2018) 10 tablet 0 Not  Taking at Unknown time   Allergies  Allergen Reactions  . Tetracyclines & Related Rash    Social History   Tobacco Use  . Smoking status: Never Smoker  . Smokeless tobacco: Never Used  Substance Use Topics  . Alcohol use: Yes    Comment: 1-2 beers 4 days /week    Family History  Problem Relation Age of Onset  . Parkinson's disease Mother   . Colon polyps Mother   . Heart disease Father   . Colon cancer Maternal Uncle 75     Objective:   Patient Vitals for the past 8 hrs:  BP Temp Temp src Pulse Resp SpO2 Height Weight  01/26/19 0731 (!) 152/103 98.1 F (36.7 C) Oral 63 16 100 % 6\' 4"  (1.93 m) 113.4 kg   No intake/output data  recorded. No intake/output data recorded.   See MD Preop evaluation      Assessment:   1.  History of adenomatous colon polyps 2.  Chronic diarrhea  Plan:   We will proceed with colonoscopy to evaluate for additional polyps and will plan on obtaining colonic biopsies to evaluate for microscopic colitis.  This is been discussed with the patient and he understands.

## 2019-01-30 ENCOUNTER — Encounter (HOSPITAL_COMMUNITY): Payer: Self-pay | Admitting: Gastroenterology

## 2019-03-06 DIAGNOSIS — G4733 Obstructive sleep apnea (adult) (pediatric): Secondary | ICD-10-CM | POA: Diagnosis not present

## 2019-04-14 DIAGNOSIS — H524 Presbyopia: Secondary | ICD-10-CM | POA: Diagnosis not present

## 2019-04-17 DIAGNOSIS — F4321 Adjustment disorder with depressed mood: Secondary | ICD-10-CM | POA: Diagnosis not present

## 2019-06-05 DIAGNOSIS — F4321 Adjustment disorder with depressed mood: Secondary | ICD-10-CM | POA: Diagnosis not present

## 2019-06-23 ENCOUNTER — Other Ambulatory Visit: Payer: Self-pay | Admitting: Urology

## 2019-06-23 MED ORDER — TADALAFIL 20 MG PO TABS: 20.0000 mg | ORAL_TABLET | Freq: Every day | ORAL | 3 refills | Status: DC | PRN

## 2019-07-07 DIAGNOSIS — F4321 Adjustment disorder with depressed mood: Secondary | ICD-10-CM | POA: Diagnosis not present

## 2019-07-31 ENCOUNTER — Ambulatory Visit: Payer: 59 | Attending: Internal Medicine

## 2019-08-31 ENCOUNTER — Other Ambulatory Visit: Payer: Self-pay | Admitting: Urology

## 2019-08-31 DIAGNOSIS — N5201 Erectile dysfunction due to arterial insufficiency: Secondary | ICD-10-CM

## 2019-09-06 DIAGNOSIS — G4733 Obstructive sleep apnea (adult) (pediatric): Secondary | ICD-10-CM | POA: Diagnosis not present

## 2019-10-27 DIAGNOSIS — H43813 Vitreous degeneration, bilateral: Secondary | ICD-10-CM | POA: Diagnosis not present

## 2019-10-27 DIAGNOSIS — M26601 Right temporomandibular joint disorder, unspecified: Secondary | ICD-10-CM | POA: Diagnosis not present

## 2019-11-17 DIAGNOSIS — F4321 Adjustment disorder with depressed mood: Secondary | ICD-10-CM | POA: Diagnosis not present

## 2019-11-20 ENCOUNTER — Other Ambulatory Visit: Payer: Self-pay | Admitting: Internal Medicine

## 2019-11-24 DIAGNOSIS — H43813 Vitreous degeneration, bilateral: Secondary | ICD-10-CM | POA: Diagnosis not present

## 2019-12-08 ENCOUNTER — Other Ambulatory Visit: Payer: Self-pay | Admitting: Urology

## 2020-02-20 ENCOUNTER — Other Ambulatory Visit: Payer: Self-pay | Admitting: Urology

## 2020-02-20 MED ORDER — TADALAFIL 20 MG PO TABS: ORAL_TABLET | ORAL | 3 refills | Status: DC

## 2020-03-05 DIAGNOSIS — G4733 Obstructive sleep apnea (adult) (pediatric): Secondary | ICD-10-CM | POA: Diagnosis not present

## 2020-04-05 ENCOUNTER — Other Ambulatory Visit: Payer: Self-pay | Admitting: Internal Medicine

## 2020-04-05 DIAGNOSIS — Z0000 Encounter for general adult medical examination without abnormal findings: Secondary | ICD-10-CM | POA: Diagnosis not present

## 2020-04-05 DIAGNOSIS — Z125 Encounter for screening for malignant neoplasm of prostate: Secondary | ICD-10-CM | POA: Diagnosis not present

## 2020-05-16 ENCOUNTER — Other Ambulatory Visit: Payer: Self-pay

## 2020-05-16 ENCOUNTER — Ambulatory Visit (INDEPENDENT_AMBULATORY_CARE_PROVIDER_SITE_OTHER): Payer: 59 | Admitting: Urology

## 2020-05-16 ENCOUNTER — Encounter: Payer: Self-pay | Admitting: Urology

## 2020-05-16 ENCOUNTER — Other Ambulatory Visit: Payer: Self-pay | Admitting: Internal Medicine

## 2020-05-16 VITALS — BP 133/95 | HR 66 | Ht 76.0 in | Wt 255.0 lb

## 2020-05-16 DIAGNOSIS — R972 Elevated prostate specific antigen [PSA]: Secondary | ICD-10-CM | POA: Diagnosis not present

## 2020-05-16 NOTE — Progress Notes (Signed)
05/16/2020 11:16 AM   Zachary Chung [birth year] [id number]  Referring provider: Albina Billet, MD [id number]   [place],  [place] [id number] Chief Complaint  Patient presents with   Other    HPI: Zachary Chung is a 59 y.o. male to discuss an elevated PSA   PSA drawn 10/17/2019 mildly elevated 5.9  Repeat PSA September 2021 5.8 (9% free PSA)  No bothersome LUTS  No dysuria or gross hematuria   PMH: Past Medical History:  Diagnosis Date   Adenomatous polyp of ascending colon    Depression    H/O NO MEDS   ED (erectile dysfunction)    GERD (gastroesophageal reflux disease)    Hypertension    Pneumonia 1994   Sleep apnea    wears BI-PAP   Wears glasses     Surgical History: Past Surgical History:  Procedure Laterality Date   BIOPSY  01/26/2019   Procedure: BIOPSY;  Surgeon: Laurence Spates, MD;  Location: [facility];  Service: Endoscopy;;   COLONOSCOPY     COLONOSCOPY WITH PROPOFOL N/A 05/04/2017   Procedure: COLONOSCOPY WITH PROPOFOL;  Surgeon: Laurence Spates, MD;  Location: [facility];  Service: Endoscopy;  Laterality: N/A;   COLONOSCOPY WITH PROPOFOL N/A 01/26/2019   Procedure: COLONOSCOPY WITH PROPOFOL;  Surgeon: Laurence Spates, MD;  Location: [facility];  Service: Endoscopy;  Laterality: N/A;   HOT HEMOSTASIS N/A 05/04/2017   Procedure: HOT HEMOSTASIS (ARGON PLASMA COAGULATION/BICAP);  Surgeon: Laurence Spates, MD;  Location: [facility];  Service: Endoscopy;  Laterality: N/A;   KNEE ARTHROSCOPY W/ ACL RECONSTRUCTION     left   POLYPECTOMY  01/26/2019   Procedure: POLYPECTOMY;  Surgeon: Laurence Spates, MD;  Location: [facility];  Service: Endoscopy;;   SEPTOPLASTY     SINUS SURGERY WITH INSTATRAK  05/20/2017   @ [place]   TRANSANAL EXCISION OF RECTAL MASS N/A 06/03/2017   Procedure: TRANSANAL EXCISION OF RECTAL POLYP;  Surgeon: [name], MD;  Location: [facility] OR[facility];  Service: General;  Laterality: N/A;    WISDOM TOOTH EXTRACTION      Home Medications:  Allergies as of 05/16/2020      Reactions   Tetracyclines & Related Rash      Medication List       Accurate as of May 16, 2020 11:16 AM. If you have any questions, ask your nurse or doctor.        acetaminophen 325 MG tablet Commonly known as: TYLENOL Take 650 mg by mouth daily as needed for moderate pain or headache.   ALPRAZolam 1 MG tablet Commonly known as: XANAX Take 1 mg by mouth at bedtime as needed for sleep.   amLODipine 5 MG tablet Commonly known as: NORVASC Take 5 mg by mouth every evening.   diphenhydrAMINE 25 MG tablet Commonly known as: BENADRYL Take 25 mg by mouth at bedtime as needed for sleep.   fluticasone 50 MCG/ACT nasal spray Commonly known as: FLONASE Place 1 spray into both nostrils daily as needed for allergies.   naproxen 500 MG tablet Commonly known as: NAPROSYN Take 500 mg by mouth 2 (two) times daily as needed for moderate pain.   tadalafil 20 MG tablet Commonly known as: CIALIS TAKE 1 TABLET BY MOUTH DAILY AS NEEDED FOR ED. TAKE 30-60 MINUTES PROIR TO INTERCOURSE.   telmisartan 20 MG tablet Commonly known as: MICARDIS Take 20 mg by mouth daily.       Allergies:  Allergies  Allergen Reactions  Tetracyclines & Related Rash    Family History: Family History  Problem Relation Age of Onset   Parkinson's disease Mother    Colon polyps Mother    Heart disease Father    Colon cancer Maternal Uncle 6    Social History:  reports that he has never smoked. He has never used smokeless tobacco. He reports current alcohol use. He reports that he does not use drugs.   Physical Exam: BP (!) 133/95    Pulse 66    Ht 6\' 4"  (1.93 m)    Wt 255 lb (115.7 kg)    BMI 31.04 kg/m   Constitutional:  Alert and oriented, No acute distress. HEENT: [place] AT, moist mucus membranes.  Trachea midline, no masses. Cardiovascular: No clubbing, cyanosis, or edema. Respiratory: Normal  respiratory effort, no increased work of breathing. GU: Prostate 40 g, smooth without nodules Psychiatric: Normal mood and affect.   Assessment & Plan:    1.  Elevated PSA  Benign DRE  Although PSA is a prostate cancer screening test he was informed that cancer is not the most common cause of an elevated PSA. Other potential causes including BPH and inflammation were discussed. He was informed that the only way to adequately diagnose prostate cancer would be a transrectal ultrasound and biopsy of the prostate. The procedure was discussed including potential risks of bleeding and infection/sepsis. He was also informed that a negative biopsy does not conclusively rule out the possibility that prostate cancer may be present and that continued monitoring is required. The use of newer adjunctive blood tests including 4kScore were discussed. The use of multiparametric prostate MRI was also discussed. Continued periodic surveillance was also discussed.  He has initially elected surveillance and will have his PSA repeated March 2022   [place] [address], [place], [place] [address] [phone]  I, Selena Batten, am acting as a scribe for Dr. Nicki Reaper C. Kristianna Saperstein,  I have reviewed the above documentation for accuracy and completeness, and I agree with the above.    Abbie Sons, MD

## 2020-05-21 ENCOUNTER — Ambulatory Visit (INDEPENDENT_AMBULATORY_CARE_PROVIDER_SITE_OTHER): Payer: 59 | Admitting: Adult Health

## 2020-05-21 ENCOUNTER — Other Ambulatory Visit: Payer: Self-pay

## 2020-05-21 DIAGNOSIS — Z23 Encounter for immunization: Secondary | ICD-10-CM

## 2020-05-22 NOTE — Progress Notes (Signed)
Nurse visit only

## 2020-05-24 DIAGNOSIS — H40003 Preglaucoma, unspecified, bilateral: Secondary | ICD-10-CM | POA: Diagnosis not present

## 2020-08-09 ENCOUNTER — Other Ambulatory Visit: Payer: Self-pay | Admitting: Urology

## 2020-08-09 MED ORDER — TADALAFIL 20 MG PO TABS: ORAL_TABLET | ORAL | 1 refills | Status: DC

## 2020-08-20 ENCOUNTER — Encounter: Payer: Self-pay | Admitting: Gastroenterology

## 2020-08-20 ENCOUNTER — Other Ambulatory Visit: Payer: Self-pay

## 2020-08-20 ENCOUNTER — Ambulatory Visit (INDEPENDENT_AMBULATORY_CARE_PROVIDER_SITE_OTHER): Payer: 59 | Admitting: Gastroenterology

## 2020-08-20 VITALS — BP 164/89 | HR 83 | Ht 76.0 in | Wt 259.2 lb

## 2020-08-20 DIAGNOSIS — R197 Diarrhea, unspecified: Secondary | ICD-10-CM

## 2020-08-22 NOTE — Progress Notes (Signed)
Gastroenterology Consultation  Referring Provider:     Jaclyn Shaggyate, Denny C, MD Primary Care Physician:  Jaclyn Shaggyate, Denny C, MD Primary Gastroenterologist:  Dr. Servando SnareWohl     Reason for Consultation:     Diarrhea        HPI:   Zachary Chung is a 60 y.o. y/o male referred for consultation & management of Diarrhea by Dr. Arlana Pouchate, Jillene Bucksenny C, MD.  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  The patient had a repeat exam without any residual polyp findings.  The patient was not sure when he needed a another colonoscopy.  The last colonoscopy was 2 years ago.  He also reports that he is been having progressively worse diarrhea over the last couple years.  It does not wake him up at night and he states that he does not have any unexplained weight loss or rectal bleeding with the diarrhea.  He does report that he can have up to 12 bowel movements throughout the day but it is usually worse in the morning.  He does endorse drinking large doses of dairy products on a daily basis.  He denies any burning with the diarrhea or any appreciable gas.  He also denies any abdominal pain.  There is no report of any passage of mucus or anything abnormal about the stool such as the smell or any report of greasy stools.  The patient does report that he has been increasing his alcohol intake for some time and he attributes it to the pandemic.  He also reports that he knows that he needs to cut down on this.  Past Medical History:  Diagnosis Date  . Adenomatous polyp of ascending colon   . Depression    H/O NO MEDS  . ED (erectile dysfunction)   . GERD (gastroesophageal reflux disease)   . Hypertension   . Pneumonia 1994  . Sleep apnea    wears BI-PAP  . Wears glasses     Past Surgical History:  Procedure Laterality Date  . BIOPSY  01/26/2019   Procedure: BIOPSY;  Surgeon: Carman ChingEdwards, James, MD;  Location: [facility];  Service: Endoscopy;;  . COLONOSCOPY    .  COLONOSCOPY WITH PROPOFOL N/A 05/04/2017   Procedure: COLONOSCOPY WITH PROPOFOL;  Surgeon: Carman ChingEdwards, James, MD;  Location: [facility];  Service: Endoscopy;  Laterality: N/A;  . COLONOSCOPY WITH PROPOFOL N/A 01/26/2019   Procedure: COLONOSCOPY WITH PROPOFOL;  Surgeon: Carman ChingEdwards, James, MD;  Location: [facility];  Service: Endoscopy;  Laterality: N/A;  . HOT HEMOSTASIS N/A 05/04/2017   Procedure: HOT HEMOSTASIS (ARGON PLASMA COAGULATION/BICAP);  Surgeon: Carman ChingEdwards, James, MD;  [facility];  Service: Endoscopy;  Laterality: N/A;  . KNEE ARTHROSCOPY W/ ACL RECONSTRUCTION     left  . POLYPECTOMY  01/26/2019   Procedure: POLYPECTOMY;  Surgeon: Carman ChingEdwards, James, MD;  Location: [facility];  Service: Endoscopy;;  . SEPTOPLASTY    . SINUS SURGERY WITH INSTATRAK  05/20/2017   @ [facility] WITH [facility]  . TRANSANAL EXCISION OF RECTAL MASS N/A 06/03/2017   Procedure: TRANSANAL EXCISION OF RECTAL POLYP;  Surgeon: Earline MayotteByrnett, Jeffrey W, MD;  Location: [facility];  Service: General;  Laterality: N/A;  . WISDOM TOOTH EXTRACTION      Prior to Admission medications   Medication Sig Start Date End Date Taking? Authorizing Provider  acetaminophen (TYLENOL) 325 MG tablet Take 650 mg by mouth daily as needed for moderate pain or headache.  Yes [provider]  ALPRAZolam Duanne Moron) 1 MG tablet Take 1 mg by mouth at bedtime as needed for sleep. 08/26/18  Yes [provider]  amLODipine (NORVASC) 5 MG tablet Take 5 mg by mouth every evening.    Yes [provider]  diphenhydrAMINE (BENADRYL) 25 MG tablet Take 25 mg by mouth at bedtime as needed for sleep.   Yes [provider]  fluticasone (FLONASE) 50 MCG/ACT nasal spray Place 1 spray into both nostrils daily as needed for allergies. 03/12/17  Yes [provider]  naproxen (NAPROSYN) 500 MG tablet Take 500 mg by mouth 2 (two) times daily as needed for moderate pain.    Yes [provider]  tadalafil (CIALIS) 20 MG  tablet TAKE 1 TABLET BY MOUTH DAILY AS NEEDED FOR ED. TAKE 30-60 MINUTES PROIR TO INTERCOURSE. 08/09/20  Yes Stoioff, Ronda Fairly, MD  telmisartan (MICARDIS) 20 MG tablet Take 20 mg by mouth daily. 04/05/20  Yes [provider]    Family History  Problem Relation Age of Onset  . Parkinson's disease Mother   . Colon polyps Mother   . Heart disease Father   . Colon cancer Maternal Uncle 54     Social History   Tobacco Use  . Smoking status: Never Smoker  . Smokeless tobacco: Never Used  Vaping Use  . Vaping Use: Never used  Substance Use Topics  . Alcohol use: Yes    Comment: 1-2 beers 4 days /week  . Drug use: No    Allergies as of 08/20/2020 - Review Complete 08/20/2020  Allergen Reaction Noted  . Tetracyclines & related Rash 04/21/2017    Review of Systems:    All systems reviewed and negative except where noted in HPI.   Physical Exam:  BP (!) 164/89   Pulse 83   Ht 6\' 4"  (1.93 m)   Wt 259 lb 3.2 oz (117.6 kg)   BMI 31.55 kg/m  No LMP for male patient. General:   Alert,  Well-developed, well-nourished, pleasant and cooperative in NAD Head:  Normocephalic and atraumatic. Eyes:  Sclera clear, no icterus.   Conjunctiva pink. Ears:  Normal auditory acuity. Neck:  Supple; no masses or thyromegaly. Lungs:  Respirations even and unlabored.  Clear throughout to auscultation.   No wheezes, crackles, or rhonchi. No acute distress. Heart:  Regular rate and rhythm; no murmurs, clicks, rubs, or gallops. Abdomen:  Normal bowel sounds.  No bruits.  Soft, non-tender and non-distended without masses, hepatosplenomegaly or hernias noted.  No guarding or rebound tenderness.  Negative Carnett sign.   Rectal:  Deferred.  Pulses:  Normal pulses noted. Extremities:  No clubbing or edema.  No cyanosis. Neurologic:  Alert and oriented x3;  grossly normal neurologically. Skin:  Intact without significant lesions or rashes.  No jaundice. Lymph Nodes:  No significant cervical  adenopathy. Psych:  Alert and cooperative. Normal mood and affect.  Imaging Studies: No results found.  Assessment and Plan:   RIORDAN WALLE is a 60 y.o. y/o male who comes in today with a history of a villous adenoma in the rectum that was removed surgically.  The patient had a follow-up back in 2020 without any residual polyp seen.  The patient's pathology was reviewed with the patient and the guidelines which showed the patient recommending a repeat colonoscopy in 3 years from the time of his last colonoscopy which would be next year. The patient has been encouraged to avoid milk products or get Lactaid milk to  see if this helps his symptoms.  His random colon biopsies did not show any sign of microscopic colitis.  He has been told that if this does not alleviate his diarrhea that there are other things we can do including fibrosing, Imodium and even a high fiber diet.  The patient has been explained the plan and agrees with it.    Lucilla Lame, MD. Marval Regal    Note: This dictation was prepared with Dragon dictation along with smaller phrase technology. Any transcriptional errors that result from this process are unintentional.

## 2020-09-05 DIAGNOSIS — G4733 Obstructive sleep apnea (adult) (pediatric): Secondary | ICD-10-CM | POA: Diagnosis not present

## 2020-09-12 ENCOUNTER — Other Ambulatory Visit: Payer: Self-pay | Admitting: Urology

## 2020-09-12 DIAGNOSIS — R972 Elevated prostate specific antigen [PSA]: Secondary | ICD-10-CM

## 2020-09-12 DIAGNOSIS — N5201 Erectile dysfunction due to arterial insufficiency: Secondary | ICD-10-CM

## 2020-09-13 DIAGNOSIS — F4321 Adjustment disorder with depressed mood: Secondary | ICD-10-CM | POA: Diagnosis not present

## 2020-09-27 ENCOUNTER — Other Ambulatory Visit: Payer: Self-pay | Admitting: Internal Medicine

## 2020-10-01 ENCOUNTER — Other Ambulatory Visit: Payer: Self-pay

## 2020-10-01 MED ORDER — VIBERZI 100 MG PO TABS: 1.0000 | ORAL_TABLET | Freq: Two times a day (BID) | ORAL | 5 refills | Status: DC

## 2020-10-02 ENCOUNTER — Other Ambulatory Visit: Payer: Self-pay | Admitting: Urology

## 2020-10-02 DIAGNOSIS — R972 Elevated prostate specific antigen [PSA]: Secondary | ICD-10-CM

## 2020-10-02 DIAGNOSIS — N5201 Erectile dysfunction due to arterial insufficiency: Secondary | ICD-10-CM

## 2020-10-07 ENCOUNTER — Other Ambulatory Visit: Payer: Self-pay | Admitting: Internal Medicine

## 2020-10-08 DIAGNOSIS — N5201 Erectile dysfunction due to arterial insufficiency: Secondary | ICD-10-CM | POA: Diagnosis not present

## 2020-10-08 DIAGNOSIS — R972 Elevated prostate specific antigen [PSA]: Secondary | ICD-10-CM | POA: Diagnosis not present

## 2020-10-16 ENCOUNTER — Other Ambulatory Visit: Payer: Self-pay | Admitting: Internal Medicine

## 2020-11-01 ENCOUNTER — Other Ambulatory Visit: Payer: Self-pay

## 2020-11-01 MED FILL — Diphenoxylate w/ Atropine Tab 2.5-0.025 MG: ORAL | 4 days supply | Qty: 30 | Fill #0 | Status: AC

## 2020-11-05 ENCOUNTER — Other Ambulatory Visit: Payer: Self-pay

## 2020-11-05 DIAGNOSIS — M25562 Pain in left knee: Secondary | ICD-10-CM | POA: Diagnosis not present

## 2020-11-05 MED ORDER — MELOXICAM 7.5 MG PO TABS
7.5000 mg | ORAL_TABLET | Freq: Two times a day (BID) | ORAL | 3 refills | Status: DC
  Filled 2020-11-05: qty 17, 9d supply, fill #0
  Filled 2020-11-05: qty 43, 21d supply, fill #0

## 2020-11-06 ENCOUNTER — Other Ambulatory Visit: Payer: Self-pay

## 2020-11-06 MED FILL — Alprazolam Tab 1 MG: ORAL | 30 days supply | Qty: 60 | Fill #0 | Status: AC

## 2020-11-18 ENCOUNTER — Other Ambulatory Visit: Payer: Self-pay | Admitting: Urology

## 2020-11-18 DIAGNOSIS — R972 Elevated prostate specific antigen [PSA]: Secondary | ICD-10-CM

## 2020-11-18 NOTE — Progress Notes (Signed)
60 y.o. male seen October 2021 for mild PSA elevation.  Follow-up PSA 10/08/2020 stable but still elevated.  Options discussed surveillance, biopsy and prostate MRI.  Would recommend a baseline MRI.  Order entered and will call with results

## 2020-11-19 DIAGNOSIS — M25562 Pain in left knee: Secondary | ICD-10-CM | POA: Diagnosis not present

## 2020-11-29 DIAGNOSIS — F4321 Adjustment disorder with depressed mood: Secondary | ICD-10-CM | POA: Diagnosis not present

## 2020-12-02 ENCOUNTER — Other Ambulatory Visit: Payer: Self-pay | Admitting: Orthopedic Surgery

## 2020-12-02 ENCOUNTER — Other Ambulatory Visit: Payer: Self-pay

## 2020-12-02 MED ORDER — AMLODIPINE BESYLATE 5 MG PO TABS
ORAL_TABLET | ORAL | 3 refills | Status: DC
  Filled 2020-12-02: qty 90, 90d supply, fill #0
  Filled 2021-03-08: qty 90, 90d supply, fill #1
  Filled 2021-06-09: qty 90, 90d supply, fill #2
  Filled 2021-09-09: qty 90, 90d supply, fill #3

## 2020-12-05 ENCOUNTER — Other Ambulatory Visit: Payer: Self-pay

## 2020-12-05 ENCOUNTER — Encounter
Admission: RE | Admit: 2020-12-05 | Discharge: 2020-12-05 | Disposition: A | Discharge status: Home / Self Care | Payer: 59 | Source: Ambulatory Visit | Attending: Orthopedic Surgery | Admitting: Orthopedic Surgery

## 2020-12-05 HISTORY — DX: Elevated prostate specific antigen (PSA): R97.20

## 2020-12-05 HISTORY — DX: Unspecified osteoarthritis, unspecified site: M19.90

## 2020-12-05 MED FILL — Alprazolam Tab 1 MG: ORAL | 30 days supply | Qty: 60 | Fill #1 | Status: AC

## 2020-12-05 NOTE — Patient Instructions (Signed)
Your procedure is scheduled on:12-12-20 THURSDAY Report to the Registration Desk on the 1st floor of the [facility]-Then proceed to the 2nd floor Surgery Desk in the [facility] To find out your arrival time, please call [phone] between 1PM - 3PM on:12-11-20 WEDNESDAY  REMEMBER: Instructions that are not followed completely may result in serious medical risk, up to and including death; or upon the discretion of your surgeon and anesthesiologist your surgery may need to be rescheduled.  Do not eat food after midnight the night before surgery.  No gum chewing, lozengers or hard candies.  You may however, drink CLEAR liquids up to 2 hours before you are scheduled to arrive for your surgery. Do not drink anything within 2 hours of your scheduled arrival time.  Clear liquids include: - water  - apple juice without pulp - gatorade  - black coffee or tea (Do NOT add milk or creamers to the coffee or tea) Do NOT drink anything that is not on this list  DO NOT TAKE ANY MEDICATION THE DAY OF SURGERY  One week prior to surgery: Stop Anti-inflammatories (NSAIDS) such as MOBIC (MELOXICAM), Advil, Aleve, Ibuprofen, Motrin, Naproxen, Naprosyn and Aspirin based products such as Excedrin, Goodys Powder, BC Powder-OK TO TAKE TYLENOL IF NEEDED  Stop ANY OVER THE COUNTER supplements/vitamins until after surgery.  No Alcohol for 24 hours before or after surgery.  No Smoking including e-cigarettes for 24 hours prior to surgery.  No chewable tobacco products for at least 6 hours prior to surgery.  No nicotine patches on the day of surgery.  Do not use any "recreational" drugs for at least a week prior to your surgery.  Please be advised that the combination of cocaine and anesthesia may have negative outcomes, up to and including death. If you test positive for cocaine, your surgery will be cancelled.  On the morning of surgery brush your teeth with toothpaste and water, you may rinse your mouth  with mouthwash if you wish. Do not swallow any toothpaste or mouthwash.  Do not wear jewelry, make-up, hairpins, clips or nail polish.  Do not wear lotions, powders, or perfumes.   Do not shave body from the neck down 48 hours prior to surgery just in case you cut yourself which could leave a site for infection.  Also, freshly shaved skin may become irritated if using the CHG soap.  Contact lenses, hearing aids and dentures may not be worn into surgery.  Do not bring valuables to the hospital. [facility] is not responsible for any missing/lost belongings or valuables.   Use CHG Soap as directed on instruction sheet.  Bring your BI-PAP to the hospital with you   Notify your doctor if there is any change in your medical condition (cold, fever, infection).  Wear comfortable clothing (specific to your surgery type) to the hospital.  Plan for stool softeners for home use; pain medications have a tendency to cause constipation. You can also help prevent constipation by eating foods high in fiber such as fruits and vegetables and drinking plenty of fluids as your diet allows.  After surgery, you can help prevent lung complications by doing breathing exercises.  Take deep breaths and cough every 1-2 hours. Your doctor may order a device called an Incentive Spirometer to help you take deep breaths. When coughing or sneezing, hold a pillow firmly against your incision with both hands. This is called "splinting." Doing this helps protect your incision. It also decreases belly discomfort.  If  you are being admitted to the hospital overnight, leave your suitcase in the car. After surgery it may be brought to your room.  If you are being discharged the day of surgery, you will not be allowed to drive home. You will need a responsible adult (18 years or older) to drive you home and stay with you that night.   If you are taking public transportation, you will need to have a responsible adult (18  years or older) with you. Please confirm with your physician that it is acceptable to use public transportation.   Please call the [facility] at [phone] if you have any questions about these instructions.  Surgery Visitation Policy:  Patients undergoing a surgery or procedure may have one family member or support person with them as long as that person is not COVID-19 positive or experiencing its symptoms.  That person may remain in the waiting area during the procedure.  Inpatient Visitation:    Visiting hours are 7 a.m. to 8 p.m. Inpatients will be allowed two visitors daily. The visitors may change each day during the patient's stay. No visitors under the age of 2. Any visitor under the age of 76 must be accompanied by an adult. The visitor must pass COVID-19 screenings, use hand sanitizer when entering and exiting the patient's room and wear a mask at all times, including in the patient's room. Patients must also wear a mask when staff or their visitor are in the room. Masking is required regardless of vaccination status.

## 2020-12-06 ENCOUNTER — Ambulatory Visit
Admission: RE | Admit: 2020-12-06 | Discharge: 2020-12-06 | Disposition: A | Discharge status: Home / Self Care | Payer: 59 | Source: Ambulatory Visit | Attending: Urology | Admitting: Urology

## 2020-12-06 DIAGNOSIS — R590 Localized enlarged lymph nodes: Secondary | ICD-10-CM | POA: Diagnosis not present

## 2020-12-06 DIAGNOSIS — N400 Enlarged prostate without lower urinary tract symptoms: Secondary | ICD-10-CM | POA: Diagnosis not present

## 2020-12-06 DIAGNOSIS — R972 Elevated prostate specific antigen [PSA]: Secondary | ICD-10-CM

## 2020-12-06 MED ORDER — GADOBUTROL 1 MMOL/ML IV SOLN
10.0000 mL | Freq: Once | INTRAVENOUS | Status: AC | PRN
  Administered 2020-12-06: 10 mL via INTRAVENOUS

## 2020-12-09 ENCOUNTER — Ambulatory Visit (INDEPENDENT_AMBULATORY_CARE_PROVIDER_SITE_OTHER): Payer: 59 | Admitting: Family Medicine

## 2020-12-09 ENCOUNTER — Other Ambulatory Visit (HOSPITAL_BASED_OUTPATIENT_CLINIC_OR_DEPARTMENT_OTHER): Payer: Self-pay

## 2020-12-09 DIAGNOSIS — Z01818 Encounter for other preprocedural examination: Secondary | ICD-10-CM | POA: Diagnosis not present

## 2020-12-09 NOTE — Progress Notes (Signed)
preop EKG only, no MD visit

## 2020-12-10 ENCOUNTER — Telehealth: Payer: Self-pay | Admitting: Urology

## 2020-12-10 DIAGNOSIS — R972 Elevated prostate specific antigen [PSA]: Secondary | ICD-10-CM

## 2020-12-10 DIAGNOSIS — R935 Abnormal findings on diagnostic imaging of other abdominal regions, including retroperitoneum: Secondary | ICD-10-CM

## 2020-12-10 NOTE — Telephone Encounter (Signed)
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  Prostate volume was calculated at 59 cc.  There was a PI-RADS 5 lesion at the extreme right posterior lateral apex PZ  We discussed PI-RADS 5 lesions are suspicious for Gleason 7 or greater prostate cancer and I recommended a fusion biopsy.  Referral was sent to [facility] for scheduling.  He will follow-up with me for the biopsy results.

## 2020-12-11 MED ORDER — CEFAZOLIN SODIUM-DEXTROSE 2-4 GM/100ML-% IV SOLN
2.0000 g | INTRAVENOUS | Status: AC
  Administered 2020-12-12: 2 g via INTRAVENOUS

## 2020-12-11 MED ORDER — CHLORHEXIDINE GLUCONATE CLOTH 2 % EX PADS
6.0000 | MEDICATED_PAD | Freq: Once | CUTANEOUS | Status: AC
  Administered 2020-12-12: 6 via TOPICAL

## 2020-12-11 MED ORDER — ACETAMINOPHEN 500 MG PO TABS: 1000.0000 mg | ORAL_TABLET | ORAL | Status: AC

## 2020-12-11 MED ORDER — CELECOXIB 200 MG PO CAPS: 200.0000 mg | ORAL_CAPSULE | ORAL | Status: AC

## 2020-12-11 MED ORDER — CHLORHEXIDINE GLUCONATE CLOTH 2 % EX PADS
6.0000 | MEDICATED_PAD | Freq: Once | CUTANEOUS | Status: AC
Start: 2020-12-11 — End: 2020-12-12
  Administered 2020-12-12: 6 via TOPICAL

## 2020-12-11 MED ORDER — FAMOTIDINE 20 MG PO TABS: 20.0000 mg | ORAL_TABLET | Freq: Once | ORAL | Status: AC

## 2020-12-11 MED ORDER — ORAL CARE MOUTH RINSE: 15.0000 mL | Freq: Once | OROMUCOSAL | Status: AC

## 2020-12-11 MED ORDER — LACTATED RINGERS IV SOLN: INTRAVENOUS | Status: DC

## 2020-12-11 MED ORDER — CHLORHEXIDINE GLUCONATE 0.12 % MT SOLN: 15.0000 mL | Freq: Once | OROMUCOSAL | Status: AC

## 2020-12-12 ENCOUNTER — Encounter
Admission: RE | Disposition: A | Discharge status: Home / Self Care | Payer: Self-pay | Source: Home / Self Care | Attending: Orthopedic Surgery

## 2020-12-12 ENCOUNTER — Encounter: Payer: Self-pay | Admitting: Orthopedic Surgery

## 2020-12-12 ENCOUNTER — Ambulatory Visit: Payer: 59 | Admitting: Certified Registered Nurse Anesthetist

## 2020-12-12 ENCOUNTER — Other Ambulatory Visit: Payer: Self-pay

## 2020-12-12 ENCOUNTER — Ambulatory Visit
Admission: RE | Admit: 2020-12-12 | Discharge: 2020-12-12 | Disposition: A | Discharge status: Home / Self Care | Payer: 59 | Attending: Orthopedic Surgery | Admitting: Orthopedic Surgery

## 2020-12-12 DIAGNOSIS — Z791 Long term (current) use of non-steroidal anti-inflammatories (NSAID): Secondary | ICD-10-CM | POA: Insufficient documentation

## 2020-12-12 DIAGNOSIS — X58XXXA Exposure to other specified factors, initial encounter: Secondary | ICD-10-CM | POA: Diagnosis not present

## 2020-12-12 DIAGNOSIS — M94262 Chondromalacia, left knee: Secondary | ICD-10-CM | POA: Diagnosis not present

## 2020-12-12 DIAGNOSIS — M175 Other unilateral secondary osteoarthritis of knee: Secondary | ICD-10-CM | POA: Insufficient documentation

## 2020-12-12 DIAGNOSIS — S83242A Other tear of medial meniscus, current injury, left knee, initial encounter: Secondary | ICD-10-CM | POA: Diagnosis not present

## 2020-12-12 DIAGNOSIS — G4730 Sleep apnea, unspecified: Secondary | ICD-10-CM | POA: Diagnosis not present

## 2020-12-12 DIAGNOSIS — M23262 Derangement of other lateral meniscus due to old tear or injury, left knee: Secondary | ICD-10-CM | POA: Diagnosis not present

## 2020-12-12 DIAGNOSIS — M2242 Chondromalacia patellae, left knee: Secondary | ICD-10-CM | POA: Insufficient documentation

## 2020-12-12 DIAGNOSIS — S83282A Other tear of lateral meniscus, current injury, left knee, initial encounter: Secondary | ICD-10-CM | POA: Diagnosis not present

## 2020-12-12 DIAGNOSIS — Z79899 Other long term (current) drug therapy: Secondary | ICD-10-CM | POA: Insufficient documentation

## 2020-12-12 HISTORY — PX: KNEE ARTHROSCOPY WITH MENISCAL REPAIR: SHX5653

## 2020-12-12 LAB — SYNOVIAL CELL COUNT + DIFF, W/ CRYSTALS
Crystals, Fluid: NONE SEEN
Eosinophils-Synovial: 0 %
Lymphocytes-Synovial Fld: 2 %
Monocyte-Macrophage-Synovial Fluid: 7 %
Neutrophil, Synovial: 91 %
WBC, Synovial: 2173 /mm3 — ABNORMAL HIGH (ref 0–200)

## 2020-12-12 SURGERY — ARTHROSCOPY, KNEE, WITH MENISCUS REPAIR
Anesthesia: General | Site: Knee | Laterality: Left

## 2020-12-12 MED ORDER — PROMETHAZINE HCL 25 MG/ML IJ SOLN: 6.2500 mg | INTRAMUSCULAR | Status: DC | PRN

## 2020-12-12 MED ORDER — LIDOCAINE HCL (PF) 1 % IJ SOLN
INTRAMUSCULAR | Status: AC
  Filled 2020-12-12: qty 30

## 2020-12-12 MED ORDER — OXYCODONE HCL 5 MG PO TABS
ORAL_TABLET | ORAL | Status: AC
  Filled 2020-12-12: qty 1

## 2020-12-12 MED ORDER — LIDOCAINE HCL (CARDIAC) PF 100 MG/5ML IV SOSY
PREFILLED_SYRINGE | INTRAVENOUS | Status: DC | PRN
  Administered 2020-12-12: 80 mg via INTRAVENOUS

## 2020-12-12 MED ORDER — BUPIVACAINE-EPINEPHRINE (PF) 0.25% -1:200000 IJ SOLN
INTRAMUSCULAR | Status: DC | PRN
  Administered 2020-12-12: 30 mL

## 2020-12-12 MED ORDER — DEXAMETHASONE SODIUM PHOSPHATE 10 MG/ML IJ SOLN
INTRAMUSCULAR | Status: AC
  Filled 2020-12-12: qty 1

## 2020-12-12 MED ORDER — BUPIVACAINE-EPINEPHRINE (PF) 0.25% -1:200000 IJ SOLN
INTRAMUSCULAR | Status: AC
  Filled 2020-12-12: qty 30

## 2020-12-12 MED ORDER — OXYCODONE HCL 5 MG PO TABS
5.0000 mg | ORAL_TABLET | ORAL | 0 refills | Status: DC | PRN
  Filled 2020-12-12: qty 40, 7d supply, fill #0

## 2020-12-12 MED ORDER — ONDANSETRON HCL 4 MG PO TABS
4.0000 mg | ORAL_TABLET | Freq: Three times a day (TID) | ORAL | 0 refills | Status: DC | PRN
  Filled 2020-12-12: qty 30, 10d supply, fill #0

## 2020-12-12 MED ORDER — NEOMYCIN-POLYMYXIN B GU 40-200000 IR SOLN
Status: AC
  Filled 2020-12-12: qty 20

## 2020-12-12 MED ORDER — OXYCODONE HCL 5 MG PO TABS
5.0000 mg | ORAL_TABLET | Freq: Once | ORAL | Status: AC
  Administered 2020-12-12: 5 mg via ORAL

## 2020-12-12 MED ORDER — MIDAZOLAM HCL 2 MG/2ML IJ SOLN
INTRAMUSCULAR | Status: DC | PRN
  Administered 2020-12-12: 2 mg via INTRAVENOUS

## 2020-12-12 MED ORDER — ASPIRIN EC 325 MG PO TBEC
325.0000 mg | DELAYED_RELEASE_TABLET | Freq: Every day | ORAL | 0 refills | Status: DC
  Filled 2020-12-12 (×2): qty 45, 45d supply, fill #0

## 2020-12-12 MED ORDER — FENTANYL CITRATE (PF) 100 MCG/2ML IJ SOLN
INTRAMUSCULAR | Status: AC
  Administered 2020-12-12: 25 ug via INTRAVENOUS
  Filled 2020-12-12: qty 2

## 2020-12-12 MED ORDER — FAMOTIDINE 20 MG PO TABS
ORAL_TABLET | ORAL | Status: AC
  Administered 2020-12-12: 20 mg via ORAL
  Filled 2020-12-12: qty 1

## 2020-12-12 MED ORDER — ONDANSETRON HCL 4 MG/2ML IJ SOLN
INTRAMUSCULAR | Status: AC
  Filled 2020-12-12: qty 2

## 2020-12-12 MED ORDER — FENTANYL CITRATE (PF) 100 MCG/2ML IJ SOLN
INTRAMUSCULAR | Status: DC | PRN
  Administered 2020-12-12 (×4): 50 ug via INTRAVENOUS

## 2020-12-12 MED ORDER — PROPOFOL 10 MG/ML IV BOLUS
INTRAVENOUS | Status: AC
  Filled 2020-12-12: qty 20

## 2020-12-12 MED ORDER — CEFAZOLIN SODIUM-DEXTROSE 2-4 GM/100ML-% IV SOLN
INTRAVENOUS | Status: AC
  Filled 2020-12-12: qty 100

## 2020-12-12 MED ORDER — LIDOCAINE HCL (PF) 1 % IJ SOLN
INTRAMUSCULAR | Status: DC | PRN
  Administered 2020-12-12: 3 mL

## 2020-12-12 MED ORDER — FENTANYL CITRATE (PF) 100 MCG/2ML IJ SOLN
25.0000 ug | INTRAMUSCULAR | Status: DC | PRN
  Administered 2020-12-12 (×3): 25 ug via INTRAVENOUS

## 2020-12-12 MED ORDER — GLYCOPYRROLATE 0.2 MG/ML IJ SOLN
INTRAMUSCULAR | Status: DC | PRN
  Administered 2020-12-12: .2 mg via INTRAVENOUS

## 2020-12-12 MED ORDER — EPINEPHRINE PF 1 MG/ML IJ SOLN
INTRAMUSCULAR | Status: AC
  Filled 2020-12-12: qty 12

## 2020-12-12 MED ORDER — PROPOFOL 10 MG/ML IV BOLUS
INTRAVENOUS | Status: DC | PRN
  Administered 2020-12-12: 200 mg via INTRAVENOUS

## 2020-12-12 MED ORDER — MIDAZOLAM HCL 2 MG/2ML IJ SOLN
INTRAMUSCULAR | Status: AC
  Filled 2020-12-12: qty 2

## 2020-12-12 MED ORDER — ONDANSETRON HCL 4 MG/2ML IJ SOLN
INTRAMUSCULAR | Status: DC | PRN
  Administered 2020-12-12: 4 mg via INTRAVENOUS

## 2020-12-12 MED ORDER — LIDOCAINE HCL (PF) 2 % IJ SOLN
INTRAMUSCULAR | Status: AC
  Filled 2020-12-12: qty 2

## 2020-12-12 MED ORDER — FENTANYL CITRATE (PF) 250 MCG/5ML IJ SOLN
INTRAMUSCULAR | Status: AC
  Filled 2020-12-12: qty 5

## 2020-12-12 MED ORDER — ACETAMINOPHEN 500 MG PO TABS
ORAL_TABLET | ORAL | Status: AC
  Administered 2020-12-12: 1000 mg via ORAL
  Filled 2020-12-12: qty 2

## 2020-12-12 MED ORDER — CHLORHEXIDINE GLUCONATE 0.12 % MT SOLN
OROMUCOSAL | Status: AC
  Administered 2020-12-12: 15 mL via OROMUCOSAL
  Filled 2020-12-12: qty 15

## 2020-12-12 MED ORDER — CELECOXIB 200 MG PO CAPS
ORAL_CAPSULE | ORAL | Status: AC
  Administered 2020-12-12: 200 mg via ORAL
  Filled 2020-12-12: qty 1

## 2020-12-12 MED ORDER — DEXAMETHASONE SODIUM PHOSPHATE 10 MG/ML IJ SOLN
INTRAMUSCULAR | Status: DC | PRN
  Administered 2020-12-12: 10 mg via INTRAVENOUS

## 2020-12-12 SURGICAL SUPPLY — 42 items
ADAPTER IRRIG TUBE 2 SPIKE SOL (ADAPTER) ×4 IMPLANT
ADPR TBG 2 SPK PMP STRL ASCP (ADAPTER) ×2
BUR RADIUS 3.5 (BURR) ×2 IMPLANT
BUR RADIUS 4.0X18.5 (BURR) ×2 IMPLANT
BUR RADIUS 5.5 (BURR) ×1 IMPLANT
CNTNR SPEC 2.5X3XGRAD LEK (MISCELLANEOUS) ×1
CONT SPEC 4OZ STER OR WHT (MISCELLANEOUS) ×1
CONT SPEC 4OZ STRL OR WHT (MISCELLANEOUS) ×1
CONTAINER SPEC 2.5X3XGRAD LEK (MISCELLANEOUS) ×1 IMPLANT
COVER WAND RF STERILE (DRAPES) ×2 IMPLANT
CUFF TOURN SGL QUICK 24 (TOURNIQUET CUFF)
CUFF TOURN SGL QUICK 34 (TOURNIQUET CUFF)
CUFF TRNQT CYL 24X4X16.5-23 (TOURNIQUET CUFF) IMPLANT
CUFF TRNQT CYL 34X4.125X (TOURNIQUET CUFF) IMPLANT
DRAPE IMP U-DRAPE 54X76 (DRAPES) ×2 IMPLANT
DURAPREP 26ML APPLICATOR (WOUND CARE) ×6 IMPLANT
GAUZE SPONGE 4X4 12PLY STRL (GAUZE/BANDAGES/DRESSINGS) ×2 IMPLANT
GAUZE XEROFORM 1X8 LF (GAUZE/BANDAGES/DRESSINGS) ×2 IMPLANT
GLOVE SURG ORTHO LTX SZ9 (GLOVE) ×4 IMPLANT
GLOVE SURG UNDER POLY LF SZ9 (GLOVE) ×2 IMPLANT
GOWN STRL REUS W/ TWL LRG LVL3 (GOWN DISPOSABLE) ×1 IMPLANT
GOWN STRL REUS W/TWL 2XL LVL3 (GOWN DISPOSABLE) ×2 IMPLANT
GOWN STRL REUS W/TWL LRG LVL3 (GOWN DISPOSABLE) ×2
IV LACTATED RINGER IRRG 3000ML (IV SOLUTION) ×16
IV LR IRRIG 3000ML ARTHROMATIC (IV SOLUTION) ×6 IMPLANT
KIT TURNOVER KIT A (KITS) ×2 IMPLANT
MANIFOLD NEPTUNE II (INSTRUMENTS) ×4 IMPLANT
MAT ABSORB  FLUID 56X50 GRAY (MISCELLANEOUS) ×2
MAT ABSORB FLUID 56X50 GRAY (MISCELLANEOUS) ×1 IMPLANT
NEEDLE HYPO 22GX1.5 SAFETY (NEEDLE) ×2 IMPLANT
PACK ARTHROSCOPY KNEE (MISCELLANEOUS) ×2 IMPLANT
PAD ABD DERMACEA PRESS 5X9 (GAUZE/BANDAGES/DRESSINGS) ×4 IMPLANT
SET TUBE SUCT SHAVER OUTFL 24K (TUBING) ×2 IMPLANT
SET TUBE TIP INTRA-ARTICULAR (MISCELLANEOUS) ×2 IMPLANT
SOL PREP PVP 2OZ (MISCELLANEOUS) ×2
SOLUTION PREP PVP 2OZ (MISCELLANEOUS) ×1 IMPLANT
STRIP CLOSURE SKIN 1/2X4 (GAUZE/BANDAGES/DRESSINGS) ×2 IMPLANT
SUT ETHILON 4-0 (SUTURE) ×2
SUT ETHILON 4-0 FS2 18XMFL BLK (SUTURE) ×1
SUTURE ETHLN 4-0 FS2 18XMF BLK (SUTURE) ×1 IMPLANT
TUBING ARTHRO INFLOW-ONLY STRL (TUBING) ×2 IMPLANT
WAND WEREWOLF FLOW 90D (MISCELLANEOUS) ×2 IMPLANT

## 2020-12-12 NOTE — Anesthesia Procedure Notes (Signed)
Procedure Name: LMA Insertion Date/Time: 12/12/2020 7:54 AM Performed by: Johnna Acosta, CRNA Pre-anesthesia Checklist: Patient identified, Emergency Drugs available, Suction available, Patient being monitored and Timeout performed Patient Re-evaluated:Patient Re-evaluated prior to induction Oxygen Delivery Method: Circle system utilized Preoxygenation: Pre-oxygenation with 100% oxygen Induction Type: IV induction LMA Size: 5.0 Tube type: Oral Number of attempts: 1 Airway Equipment and Method: Oral airway Placement Confirmation: positive ETCO2 and breath sounds checked- equal and bilateral Tube secured with: Tape Dental Injury: Teeth and Oropharynx as per pre-operative assessment

## 2020-12-12 NOTE — H&P (Addendum)
PREOPERATIVE H&P  Chief Complaint: LEFT KNEE TORN MEDIAL MENISCUS  HPI: Zachary Chung is a 60 y.o. male who presents for preoperative history and physical with a diagnosis of LEFT KNEE TORN MEDIAL MENISCUS confirmed by MRI.  Patient with acute onset of medial sided left knee pain which has not improved with nonoperative management including corticosteroid injection.  Symptoms of pain, swelling and clinical symptoms are significantly impairing activities of daily living and his ability to perform fully at work.  Patient wished to proceed with left knee arthroscopic partial medial meniscectomy.  Patient has known osteoarthritis in the left knee as a result of an ACL reconstruction years ago.  The majority of his arthrosis is in the lateral patellofemoral compartments which are not where he is symptomatic.  Past Medical History:  Diagnosis Date  . Adenomatous polyp of ascending colon   . Arthritis   . Depression    H/O NO MEDS  . ED (erectile dysfunction)   . Elevated PSA   . GERD (gastroesophageal reflux disease)   . Hypertension   . Pneumonia 1994  . Sleep apnea    wears BI-PAP  . Wears glasses    Past Surgical History:  Procedure Laterality Date  . BIOPSY  01/26/2019   Procedure: BIOPSY;  Surgeon: Laurence Spates, MD;  [facility]: [facility];  Service: Endoscopy;;  . COLONOSCOPY    . COLONOSCOPY WITH PROPOFOL N/A 05/04/2017   Procedure: COLONOSCOPY WITH PROPOFOL;  Surgeon: Laurence Spates, MD;  Location: [facility];  Service: Endoscopy;  Laterality: N/A;  . COLONOSCOPY WITH PROPOFOL N/A 01/26/2019   Procedure: COLONOSCOPY WITH PROPOFOL;  Surgeon: Laurence Spates, MD;  Location: [facility];  Service: Endoscopy;  Laterality: N/A;  . HOT HEMOSTASIS N/A 05/04/2017   Procedure: HOT HEMOSTASIS (ARGON PLASMA COAGULATION/BICAP);  Surgeon: Laurence Spates, MD;  Location: [facility];  Service: Endoscopy;  Laterality: N/A;  . KNEE ARTHROSCOPY W/ ACL RECONSTRUCTION     left  . POLYPECTOMY   01/26/2019   Procedure: POLYPECTOMY;  Surgeon: Laurence Spates, MD;  Location: [facility];  Service: Endoscopy;;  . SEPTOPLASTY    . SINUS SURGERY WITH INSTATRAK  05/20/2017   @ [facility]  . TRANSANAL EXCISION OF RECTAL MASS N/A 06/03/2017   Procedure: TRANSANAL EXCISION OF RECTAL POLYP;  Surgeon: [name], MD;  Location: [facility];  Service: General;  Laterality: N/A;  . WISDOM TOOTH EXTRACTION     Social History   Socioeconomic History  . Marital status: Married    Spouse name: Not on file  . Number of children: Not on file  . Years of education: Not on file  . Highest education level: Not on file  Occupational History  . Not on file  Tobacco Use  . Smoking status: Current Some Day Smoker    Types: Cigars  . Smokeless tobacco: Never Used  . Tobacco comment: VERY SELDOM ONLY WHEN HE PLAYS GOLF   Vaping Use  . Vaping Use: Never used  Substance and Sexual Activity  . Alcohol use: Yes    Comment: 1-2 beers 4 days /week  . Drug use: No  . Sexual activity: Yes    Birth control/protection: None  Other Topics Concern  . Not on file  Social History Narrative  . Not on file   Social Determinants of Health   Financial Resource Strain: Not on file  Food Insecurity: Not on file  Transportation Needs: Not on file  Physical Activity: Not on file  Stress: Not on file  Social Connections: Not on file   Family History  Problem Relation Age of Onset  . Parkinson's disease Mother   . Colon polyps Mother   . Heart disease Father   . Colon cancer Maternal Uncle 75   Allergies  Allergen Reactions  . Tetracyclines & Related Rash   Prior to Admission medications   Medication Sig Start Date End Date Taking? Authorizing Provider  ALPRAZolam (XANAX) 1 MG tablet TAKE ONE TABLET BY MOUTH TWICE DAILY Patient taking differently: Take 1 mg by mouth at bedtime as needed for sleep. 10/07/20 04/05/21 Yes Albina Billet, MD  amLODipine (NORVASC) 5 MG tablet take 1 tablet  (5 mg) by oral route once daily Patient taking differently: Take 5 mg by mouth at bedtime. 12/02/20  Yes   diphenoxylate-atropine (LOMOTIL) 2.5-0.025 MG tablet TAKE 2 TABLETS BY MOUTH 4 TIMES PER DAY AS NEEDED Patient taking differently: Take 2 tablets by mouth 4 (four) times daily as needed for diarrhea or loose stools. 10/16/20 04/14/21 Yes Albina Billet, MD  Eluxadoline 100 MG TABS TAKE 1 TABLET BY MOUTH TWICE A DAY 10/01/20 03/30/21 Yes Lucilla Lame, MD  meloxicam (MOBIC) 7.5 MG tablet Take 7.5 mg by mouth 2 (two) times daily.   Yes [provider]  tadalafil (CIALIS) 20 MG tablet TAKE 1 TABLET BY MOUTH DAILY AS NEEDED FOR ED. TAKE 30-60 MINUTES PROIR TO INTERCOURSE. Patient taking differently: Take 20 mg by mouth daily as needed for erectile dysfunction. TAKE 30-60 MINUTES PROIR TO INTERCOURSE. 08/09/20  Yes Stoioff, Ronda Fairly, MD  telmisartan (MICARDIS) 80 MG tablet TAKE 1 TABLET BY MOUTH ONCE DAILY Patient taking differently: Take 80 mg by mouth at bedtime. 09/27/20 09/27/21 Yes Albina Billet, MD  telmisartan (MICARDIS) 20 MG tablet TAKE 1 TABLET BY MOUTH ONCE DAILY Patient not taking: No sig reported 04/05/20 04/05/21  Albina Billet, MD     Positive ROS: All other systems have been reviewed and were otherwise negative with the exception of those mentioned in the HPI and as above.  Physical Exam: General: Alert, no acute distress Cardiovascular: Regular rate and rhythm, no murmurs rubs or gallops.  No pedal edema Respiratory: Clear to auscultation bilaterally, no wheezes rales or rhonchi. No cyanosis, no use of accessory musculature GI: No organomegaly, abdomen is soft and non-tender nondistended with positive bowel sounds. Skin: Skin intact, no lesions within the operative field. Neurologic: Sensation intact distally Psychiatric: Patient is competent for consent with normal mood and affect Lymphatic: No cervical lymphadenopathy  MUSCULOSKELETAL: Left knee: Patient skin is intact.   There is no erythema ecchymosis or large effusion.  He has no significant instability to Lachman's or anterior drawer testing.  He has no instability to varus or valgus stress testing at 0 and 30 degrees of flexion.  He has a positive McMurray's test and tenderness over the medial joint line.  He has no calf tenderness or lower leg edema.  He has palpable pedal pulses, intact sensation light touch and intact motor function distally.  Assessment: LEFT KNEE TORN MEDIAL MENISCUS  Plan: Plan for Procedure(s): LEFT KNEE ARTHROSCOPY WITH MENISCAL REPAIR  I reviewed the details of the operation as well as the postoperative course with the patient.  A preop history and physical was performed at the bedside this morning.  I marked the left knee according the hospital's quickset of surgery protocol.  I reviewed the patient's labs and MRI in preparation for this case.  I discussed the risks and benefits of surgery.  The risks include but are not limited to infection, bleeding, nerve or blood vessel injury, joint stiffness or loss of motion, persistent pain, weakness or instability, retear of the meniscus, DVT/pulm and embolism and the need for further surgery. Patient understood these risks and wished to proceed.     Thornton Park, MD   12/12/2020 7:45 AM

## 2020-12-12 NOTE — Op Note (Signed)
PATIENT:  Zachary Chung  PRE-OPERATIVE DIAGNOSIS:  TEAR OF MEDIAL MENISCUS, LEFT KNEE  Chung-OPERATIVE DIAGNOSIS: Tears of medial and lateral meniscus, tricompartmental osteoarthritis with grade IV chondromalacia  PROCEDURE: Left knee arthroscopic partial medial and lateral meniscectomies, tricompartmental chondroplasties, limited tricompartmental synovectomy  SURGEON:  Thornton Park, MD  ANESTHESIA:   General  PREOPERATIVE INDICATIONS:  Zachary Chung  60 y.o. male with the acute onset of medial sided left knee pain with MRI confirmation of a medial meniscus tear with osteoarthritis.  Given the patient's acute onset of knee pain in the setting of the medial meniscus tear, and failure to respond to nonoperative treatment, he elected to proceed with a left knee partial medial meniscectomy.    The risks benefits and alternatives were discussed with the patient preoperatively including the risks of infection, bleeding, nerve injury, knee stiffness, persistent pain, retear of the meniscus, osteoarthritis and the need for further surgery. Medical  risks include DVT and pulmonary embolism, myocardial infarction, stroke, pneumonia, respiratory failure and death. The patient understood these risks and wished to proceed.  OPERATIVE FINDINGS: Tear of the posterior horn of the medial meniscus with radial component transecting the meniscus near the tear medial root.  Patient had grade IV chondromalacia of the posterior tibial plateau and diffuse grade 3/4 chondrosis of the medial femoral condyle.  Patient had large marginal osteophytes and osteophytes of the intercondylar notch.  The patient's ACL graft appeared to be intact but had mild to moderate laxity.  Patient had extensive grade IV chondromalacia of the trochlea and lateral facet of the patella as well as lateral femoral condyle and tibial plateau.  Patient had a degenerative tear of the lateral meniscus involving the mid body extending into  the posterior horn.  OPERATIVE PROCEDURE: Patient was met in the preoperative area. The left knee  was signed with the word yes and my initials according the hospital's correct site of surgery protocol, after verbally confirming with the patient that this was the correct site of surgery.  A preop history and physical was performed at the bedside this morning.  I reviewed the details of the operation as well as the postoperative course with the patient.  I reviewed the patient's MRI in preparation for this case.  The patient was then brought to the operating room where they was placed supine on the operative table. General anesthesia was administered. The patient was prepped and draped in a sterile fashion.  A timeout was performed to verify the patient's name, date of birth, medical record number, correct site of surgery correct procedure to be performed. It was also used to verify the patient received antibiotics (2 g of Ancef IV), and that all appropriate instruments, and radiographic studies were available in the room. Once all in attendance were in agreement, the case began.  Proposed arthroscopy incisions were drawn out with a surgical marker. These were pre-injected with 1% lidocaine plain. An 11 blade was used to establish an inferior lateral and inferomedial portals. The inferomedial portal was created using a 18-gauge spinal needle under direct visualization.  A full diagnostic examination of the knee was performed including the suprapatellar pouch, patellofemoral joint, medial lateral compartments as well as the medial lateral gutters, the intercondylar notch in the posterior knee.  Findings on arthroscopy included noted above.  Patient had the medial meniscal tear treated with a 3.65mm  resector shaver blade and straight duckbill basket. The medial meniscus was debrided until a stable rim was achieved.  The radial tear  of the medial meniscus could not be repaired and caused medial meniscus  extrusion.  A chondroplasty of the medial femoral condyle was performed with a 3.5 mm resector shaver blade.  The patient's leg was then placed in a figure-of-four position.  A degenerative tear of the lateral meniscus involving the mid body extending to the posterior horn was identified.  This was debrided using a 4.0 mm resector shaver blade till a stable rim was achieved.  A chondroplasty of the lateral femoral condyle and lateral tibial plateau was also performed using the 4.0 mm resector shaver blade.  The lateral meniscus was probed for stability and final arthroscopic images were taken.  The scope was then placed into the patellofemoral compartment.  Extensive grade 3/4 chondrosis was encountered.  A 4.0 mm resector shaver blade was used to perform a chondroplasty of the, trochlea and undersurface of patella.  Patient had extensive tricompartmental synovitis and a synovectomy was performed with a 90 degree [employer] werewolf wand.  No images of the left knee were taken.  The knee was then copiously lavaged. All arthroscopic instruments were removed. The two arthroscopy portals were closed with 4-0 nylon. Steri-Strips were applied along with a dry sterile and compressive dressing.  0.25% Marcaine with epinephrine was injected x30 cc intra-articularly to assist with postoperative pain management.  The patient was brought to the PACU in stable condition. I was scrubbed and present for the entire case and all sharp and instrument counts were correct at the conclusion the case. I spoke with the patient's wife by phone from the PACU postoperatively to let her know the case was performed without complication and the patient was stable in the recovery room.    Timoteo Gaul, MD

## 2020-12-12 NOTE — Discharge Instructions (Signed)

## 2020-12-12 NOTE — Transfer of Care (Signed)
Immediate Anesthesia Transfer of Care Note  Patient: Zachary Chung  Procedure(s) Performed: KNEE ARTHROSCOPY WITH MENISCAL REPAIR (Left Knee)  Patient Location: PACU  Anesthesia Type:General  Level of Consciousness: sedated  Airway & Oxygen Therapy: Patient Spontanous Breathing and Patient connected to face mask oxygen  Post-op Assessment: Report given to RN and Post -op Vital signs reviewed and stable  Post vital signs: Reviewed and stable  Last Vitals:  Vitals Value Taken Time  BP 135/76 12/12/20 0945  Temp 36.1 C 12/12/20 0943  Pulse 59 12/12/20 0948  Resp 9 12/12/20 0948  SpO2 100 % 12/12/20 0948  Vitals shown include unvalidated device data.  Last Pain:  Vitals:   12/12/20 0629  TempSrc: Temporal  PainSc: 1          Complications: No complications documented.

## 2020-12-12 NOTE — Anesthesia Postprocedure Evaluation (Signed)
Anesthesia Post Note  Patient: Zachary Chung  Procedure(s) Performed: KNEE ARTHROSCOPY WITH MENISCAL REPAIR (Left Knee)  Patient location during evaluation: PACU Anesthesia Type: General Level of consciousness: awake and alert Pain management: pain level controlled Vital Signs Assessment: post-procedure vital signs reviewed and stable Respiratory status: spontaneous breathing, nonlabored ventilation, respiratory function stable and patient connected to nasal cannula oxygen Cardiovascular status: blood pressure returned to baseline and stable Postop Assessment: no apparent nausea or vomiting Anesthetic complications: no   No complications documented.   Last Vitals:  Vitals:   12/12/20 1117 12/12/20 1157  BP: (!) 177/99 (!) 151/104  Pulse: 65 (!) 59  Resp: 15 17  Temp: (!) 36.1 C   SpO2: 97% 99%    Last Pain:  Vitals:   12/12/20 1117  TempSrc: Temporal  PainSc: 4                  Martha Clan

## 2020-12-12 NOTE — Anesthesia Preprocedure Evaluation (Signed)
Anesthesia Evaluation  Patient identified by MRN, date of birth, ID band Patient awake    Reviewed: Allergy & Precautions, H&P , NPO status , Patient's Chart, lab work & pertinent test results  History of Anesthesia Complications Negative for: history of anesthetic complications  Airway Mallampati: III  TM Distance: <3 FB Neck ROM: full    Dental  (+) Chipped, Poor Dentition   Pulmonary neg shortness of breath, sleep apnea and Continuous Positive Airway Pressure Ventilation , neg COPD, neg recent URI, Current Smoker,           Cardiovascular Exercise Tolerance: Good hypertension, (-) angina(-) Past MI and (-) DOE (-) dysrhythmias      Neuro/Psych PSYCHIATRIC DISORDERS Depression negative neurological ROS     GI/Hepatic Neg liver ROS, GERD  Medicated and Controlled,  Endo/Other  negative endocrine ROS  Renal/GU      Musculoskeletal   Abdominal   Peds  Hematology negative hematology ROS (+)   Anesthesia Other Findings Past Medical History: No date: Adenomatous polyp of ascending colon No date: Depression     Comment:  H/O NO MEDS No date: GERD (gastroesophageal reflux disease) No date: Hypertension 1994: Pneumonia No date: Sleep apnea     Comment:  wears CPAP  Past Surgical History: No date: COLONOSCOPY 05/04/2017: COLONOSCOPY WITH PROPOFOL; N/A     Comment:  Procedure: COLONOSCOPY WITH PROPOFOL;  Surgeon: Laurence Spates, MD;  Location: [facility];  Service: Endoscopy;               Laterality: N/A; 05/04/2017: HOT HEMOSTASIS; N/A     Comment:  Procedure: HOT HEMOSTASIS (ARGON PLASMA               COAGULATION/BICAP);  Surgeon: Laurence Spates, MD;                Location: [facility];  Service: Endoscopy;  Laterality:              N/A; No date: KNEE ARTHROSCOPY W/ ACL RECONSTRUCTION     Comment:  left 05/20/2017: SINUS SURGERY WITH INSTATRAK     Comment:  @ [facility] WITH MADISION CLARK No  date: WISDOM TOOTH EXTRACTION  BMI    Body Mass Index:  31.28 kg/m      Reproductive/Obstetrics negative OB ROS                             Anesthesia Physical  Anesthesia Plan  ASA: III  Anesthesia Plan: General   Post-op Pain Management:    Induction: Intravenous  PONV Risk Score and Plan: 2 and Ondansetron, Dexamethasone, Midazolam and Treatment may vary due to age or medical condition  Airway Management Planned: Oral ETT and LMA  Additional Equipment:   Intra-op Plan:   Post-operative Plan: Extubation in OR  Informed Consent: I have reviewed the patients History and Physical, chart, labs and discussed the procedure including the risks, benefits and alternatives for the proposed anesthesia with the patient or authorized representative who has indicated his/her understanding and acceptance.     Dental Advisory Given  Plan Discussed with: Anesthesiologist, CRNA and Surgeon  Anesthesia Plan Comments: (Patient consented for risks of anesthesia including but not limited to:  - adverse reactions to medications - damage to teeth, lips or other oral mucosa - sore throat or hoarseness - Damage to heart, brain, lungs or loss of life  Patient  voiced understanding.)        Anesthesia Quick Evaluation

## 2020-12-14 ENCOUNTER — Emergency Department
Admission: EM | Admit: 2020-12-14 | Discharge: 2020-12-14 | Disposition: A | Discharge status: Home / Self Care | Payer: 59 | Attending: Emergency Medicine | Admitting: Emergency Medicine

## 2020-12-14 ENCOUNTER — Emergency Department: Payer: 59

## 2020-12-14 ENCOUNTER — Encounter: Payer: Self-pay | Admitting: Intensive Care

## 2020-12-14 ENCOUNTER — Other Ambulatory Visit: Payer: Self-pay

## 2020-12-14 DIAGNOSIS — I82402 Acute embolism and thrombosis of unspecified deep veins of left lower extremity: Secondary | ICD-10-CM | POA: Insufficient documentation

## 2020-12-14 DIAGNOSIS — I8290 Acute embolism and thrombosis of unspecified vein: Secondary | ICD-10-CM

## 2020-12-14 DIAGNOSIS — Z7982 Long term (current) use of aspirin: Secondary | ICD-10-CM | POA: Insufficient documentation

## 2020-12-14 DIAGNOSIS — I10 Essential (primary) hypertension: Secondary | ICD-10-CM | POA: Insufficient documentation

## 2020-12-14 DIAGNOSIS — M7122 Synovial cyst of popliteal space [Baker], left knee: Secondary | ICD-10-CM | POA: Diagnosis not present

## 2020-12-14 DIAGNOSIS — F17290 Nicotine dependence, other tobacco product, uncomplicated: Secondary | ICD-10-CM | POA: Diagnosis not present

## 2020-12-14 DIAGNOSIS — I82812 Embolism and thrombosis of superficial veins of left lower extremities: Secondary | ICD-10-CM | POA: Diagnosis not present

## 2020-12-14 DIAGNOSIS — M7989 Other specified soft tissue disorders: Secondary | ICD-10-CM | POA: Diagnosis not present

## 2020-12-14 DIAGNOSIS — Z79899 Other long term (current) drug therapy: Secondary | ICD-10-CM | POA: Diagnosis not present

## 2020-12-14 MED ORDER — APIXABAN 5 MG PO TABS: 5.0000 mg | ORAL_TABLET | Freq: Two times a day (BID) | ORAL | 1 refills | Status: DC

## 2020-12-14 NOTE — Discharge Instructions (Signed)
Follow-up with your regular doctor.  Return emergency department worsening.  Take medication as prescribed

## 2020-12-14 NOTE — ED Provider Notes (Signed)
[facility] Emergency Department Provider Note  ____________________________________________   Event Date/Time   First MD Initiated Contact with Patient 12/14/20 0827     (approximate)  I have reviewed the triage vital signs and the nursing notes.   HISTORY  Chief Complaint Post-op Problem    HPI Zachary Chung is a 60 y.o. male presents emergency department complaining of left lower leg swelling, patient had recent surgery.  Had a meniscus repair.  Patient states the swelling is much worse than it was yesterday.  His left foot is 2 times the size of the right.  He denies any chest pain or shortness of breath.  States he was taking the aspirin per day.  He was not given Lovenox or Eliquis as clot prevention.    Past Medical History:  Diagnosis Date  . Adenomatous polyp of ascending colon   . Arthritis   . Depression    H/O NO MEDS  . ED (erectile dysfunction)   . Elevated PSA   . GERD (gastroesophageal reflux disease)   . Hypertension   . Pneumonia 1994  . Sleep apnea    wears BI-PAP  . Wears glasses     Patient Active Problem List   Diagnosis Date Noted  . Rectal polyp 05/19/2017  . Hypertrophy of both inferior nasal turbinates 03/16/2017  . 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 Nasal congestion 03/16/2017    Past Surgical History:  Procedure Laterality Date  . BIOPSY  01/26/2019   Procedure: BIOPSY;  Surgeon: Laurence Spates, MD;  Location: [facility];  Service: Endoscopy;;  . COLONOSCOPY    . COLONOSCOPY WITH PROPOFOL N/A 05/04/2017   Procedure: COLONOSCOPY WITH PROPOFOL;  Surgeon: Laurence Spates, MD;  Location: [facility];  Service: Endoscopy;  Laterality: N/A;  . COLONOSCOPY WITH PROPOFOL N/A 01/26/2019   Procedure: COLONOSCOPY WITH PROPOFOL;  Surgeon: Laurence Spates, MD;  Location: [facility];  Service: Endoscopy;  Laterality: N/A;  . HOT HEMOSTASIS N/A 05/04/2017   Procedure: HOT HEMOSTASIS (ARGON PLASMA COAGULATION/BICAP);   Surgeon: Laurence Spates, MD;  Location: [facility];  Service: Endoscopy;  Laterality: N/A;  . KNEE ARTHROSCOPY W/ ACL RECONSTRUCTION     left  . KNEE ARTHROSCOPY WITH MENISCAL REPAIR Left 12/12/2020   Procedure: KNEE ARTHROSCOPY WITH MENISCAL REPAIR;  Surgeon: Thornton Park, MD;  Location: [facility];  Service: Orthopedics;  Laterality: Left;  . POLYPECTOMY  01/26/2019   Procedure: POLYPECTOMY;  Surgeon: Laurence Spates, MD;  Location: [facility];  Service: Endoscopy;;  . SEPTOPLASTY    . SINUS SURGERY WITH INSTATRAK  05/20/2017   @ [place]  . TRANSANAL EXCISION OF RECTAL MASS N/A 06/03/2017   Procedure: TRANSANAL EXCISION OF RECTAL POLYP;  Surgeon: [name], MD;  Location: [facility];  Service: General;  Laterality: N/A;  . WISDOM TOOTH EXTRACTION      Prior to Admission medications   Medication Sig Start Date End Date Taking? Authorizing Provider  apixaban (ELIQUIS) 5 MG TABS tablet Take 1 tablet (5 mg total) by mouth 2 (two) times daily. 12/14/20  Yes Antoinette Borgwardt, Linden Dolin, PA-C  ALPRAZolam Duanne Moron) 1 MG tablet TAKE ONE TABLET BY MOUTH TWICE DAILY Patient taking differently: Take 1 mg by mouth at bedtime as needed for sleep. 10/07/20 04/05/21  Albina Billet, MD  amLODipine (NORVASC) 5 MG tablet take 1 tablet (5 mg) by oral route once daily Patient taking differently: Take 5 mg by mouth at bedtime. 12/02/20     aspirin EC 325  MG tablet Take 1 tablet (325 mg total) by mouth daily. 12/12/20   Thornton Park, MD  diphenoxylate-atropine (LOMOTIL) 2.5-0.025 MG tablet TAKE 2 TABLETS BY MOUTH 4 TIMES PER DAY AS NEEDED Patient taking differently: Take 2 tablets by mouth 4 (four) times daily as needed for diarrhea or loose stools. 10/16/20 04/14/21  Albina Billet, MD  Eluxadoline 100 MG TABS TAKE 1 TABLET BY MOUTH TWICE A DAY 10/01/20 03/30/21  Lucilla Lame, MD  ondansetron (ZOFRAN) 4 MG tablet Take 1 tablet (4 mg total) by mouth every 8 (eight) hours as needed for nausea or vomiting.  12/12/20   Thornton Park, MD  oxyCODONE (OXY IR/ROXICODONE) 5 MG immediate release tablet Take 1 tablet (5 mg total) by mouth every 4 (four) hours as needed. 12/12/20   Thornton Park, MD  tadalafil (CIALIS) 20 MG tablet TAKE 1 TABLET BY MOUTH DAILY AS NEEDED FOR ED. TAKE 30-60 MINUTES PROIR TO INTERCOURSE. Patient taking differently: Take 20 mg by mouth daily as needed for erectile dysfunction. TAKE 30-60 MINUTES PROIR TO INTERCOURSE. 08/09/20   Stoioff, Ronda Fairly, MD  telmisartan (MICARDIS) 80 MG tablet TAKE 1 TABLET BY MOUTH ONCE DAILY Patient taking differently: Take 80 mg by mouth at bedtime. 09/27/20 09/27/21  Albina Billet, MD    Allergies Tetracyclines & related  Family History  Problem Relation Age of Onset  . Parkinson's disease Mother   . Colon polyps Mother   . Heart disease Father   . Colon cancer Maternal Uncle 42    Social History Social History   Tobacco Use  . Smoking status: Current Some Day Smoker    Types: Cigars  . Smokeless tobacco: Never Used  . Tobacco comment: VERY SELDOM ONLY WHEN HE PLAYS GOLF   Vaping Use  . Vaping Use: Never used  Substance Use Topics  . Alcohol use: Yes    Alcohol/week: 21.0 standard drinks    Types: 21 Cans of beer per week  . Drug use: No    Review of Systems  Constitutional: No fever/chills Eyes: No visual changes. ENT: No sore throat. Respiratory: Denies cough Cardiovascular: Denies chest pain  Genitourinary: Negative for dysuria. Musculoskeletal: Negative for back pain.  Positive left lower leg pain Skin: Negative for rash. Psychiatric: no mood changes,     ____________________________________________   PHYSICAL EXAM:  VITAL SIGNS: ED Triage Vitals  Enc Vitals Group     BP 12/14/20 0819 (!) 165/113     Pulse Rate 12/14/20 0819 96     Resp 12/14/20 0819 18     Temp 12/14/20 0819 98.6 F (37 C)     Temp Source 12/14/20 0819 Oral     SpO2 12/14/20 0819 99 %     Weight 12/14/20 0821 237 lb (107.5 kg)      Height 12/14/20 0821 6\' 4"  (1.93 m)     Head Circumference --      Peak Flow --      Pain Score 12/14/20 0820 6     Pain Loc --      Pain Edu? --      Excl. in [place]? --     Constitutional: Alert and oriented. Well appearing and in no acute distress. Eyes: Conjunctivae are normal.  Head: Atraumatic. Nose: No congestion/rhinnorhea. Mouth/Throat: Mucous membranes are moist.   Neck:  supple no lymphadenopathy noted Cardiovascular: Normal rate, regular rhythm. Heart sounds are normal Respiratory: Normal respiratory effort.  No retractions, lungs c t a  GU: deferred Musculoskeletal: Decreased range  of motion of the left knee, swelling and warmth noted at the knee, left lower leg is much more swollen when compared to the right, left foot is edematous, neurovascular is intact  neurologic:  Normal speech and language.  Skin:  Skin is warm, dry and intact. No rash noted. Psychiatric: Mood and affect are normal. Speech and behavior are normal.  ____________________________________________   LABS (all labs ordered are listed, but only abnormal results are displayed)  Labs Reviewed - No data to display ____________________________________________   ____________________________________________  RADIOLOGY  Ultrasound left lower extremity  ____________________________________________   PROCEDURES  Procedure(s) performed: No  Procedures    ____________________________________________   INITIAL IMPRESSION / ASSESSMENT AND PLAN / ED COURSE  Pertinent labs & imaging results that were available during my care of the patient were reviewed by me and considered in my medical decision making (see chart for details).   Patient is a 60 year old male presents with left lower leg pain.  See HPI.  Physical exam shows patient appears stable  DDx: DVT, phlebitis, surgical complication   Ultrasound shows thrombus in the tibial vein, due to patient's past medical history/family history of  DVTs we will start him on Eliquis.  He is to follow-up with vascular.  Return emergency department worsening.  States he understands.  Is discharged stable condition.  Zachary Chung was evaluated in Emergency Department on 12/14/2020 for the symptoms described in the history of present illness. He was evaluated in the context of the global COVID-19 pandemic, which necessitated consideration that the patient might be at risk for infection with the SARS-CoV-2 virus that causes COVID-19. Institutional protocols and algorithms that pertain to the evaluation of patients at risk for COVID-19 are in a state of rapid change based on information released by regulatory bodies including the CDC and federal and state organizations. These policies and algorithms were followed during the patient's care in the ED.    As part of my medical decision making, I reviewed the following data within the [place] History obtained from family, Nursing notes reviewed and incorporated, Old chart reviewed, Radiograph reviewed , Notes from prior ED visits and [place] Controlled Substance Database  ____________________________________________   FINAL CLINICAL IMPRESSION(S) / ED DIAGNOSES  Final diagnoses:  Thrombus      NEW MEDICATIONS STARTED DURING THIS VISIT:  Discharge Medication List as of 12/14/2020 10:16 AM    START taking these medications   Details  apixaban (ELIQUIS) 5 MG TABS tablet Take 1 tablet (5 mg total) by mouth 2 (two) times daily., Starting Sat 12/14/2020, Normal         Note:  This document was prepared using Dragon voice recognition software and may include unintentional dictation errors.    Versie Starks, PA-C 12/14/20 1354    Lavonia Drafts, MD 12/14/20 [phone]

## 2020-12-14 NOTE — ED Triage Notes (Signed)
Patient had recent surgery on left leg. Awoke this AM with foot and leg swelling

## 2020-12-15 LAB — BODY FLUID CULTURE W GRAM STAIN: Culture: NO GROWTH

## 2020-12-17 ENCOUNTER — Other Ambulatory Visit: Payer: Self-pay

## 2020-12-17 MED ORDER — OXYCODONE HCL 5 MG PO TABS
ORAL_TABLET | ORAL | 0 refills | Status: DC
  Filled 2020-12-17: qty 16, 2d supply, fill #0

## 2020-12-25 ENCOUNTER — Other Ambulatory Visit (INDEPENDENT_AMBULATORY_CARE_PROVIDER_SITE_OTHER): Payer: Self-pay | Admitting: Vascular Surgery

## 2020-12-25 DIAGNOSIS — M7989 Other specified soft tissue disorders: Secondary | ICD-10-CM

## 2020-12-25 NOTE — Progress Notes (Signed)
MRN : [id number]  Zachary Chung is a 60 y.o. (Apr 22, 1961) male who presents with chief complaint of dvt follow up.  History of Present Illness:   The patient presents to the office for evaluation of DVT.  DVT was identified at [facility] by Duplex ultrasound performed 5/28.  The initial symptoms were pain and swelling in the lower extremity.  Duplex Ultrasound Impression: 1. Possible, but not conclusive, thrombus in the posterior tibial vein. No other findings to suggest deep venous thrombosis of the left lower extremity.  He is s/p left knee arthroscopy on 12/12/2020.  The patient notes the leg continues to be very painful with dependency and swells quite a bite.  Symptoms are much better with elevation.  The patient notes minimal edema in the morning which steadily worsens throughout the day.    The patient has not been using compression therapy at this point.  No SOB or pleuritic chest pains.  No cough or hemoptysis.  No blood per rectum or blood in any sputum.  No excessive bruising per the patient.   Duplex ultrasound obtained today shows total resolution of the tibial DVT  No outpatient medications have been marked as taking for the 12/26/20 encounter (Appointment) with Delana Meyer, Dolores Lory, MD.    Past Medical History:  Diagnosis Date   Adenomatous polyp of ascending colon    Arthritis    Depression    H/O NO MEDS   ED (erectile dysfunction)    Elevated PSA    GERD (gastroesophageal reflux disease)    Hypertension    Pneumonia 1994   Sleep apnea    wears BI-PAP   Wears glasses     Past Surgical History:  Procedure Laterality Date   BIOPSY  01/26/2019   Procedure: BIOPSY;  Surgeon: Laurence Spates, MD;  Location: [facility];  Service: Endoscopy;;   COLONOSCOPY     COLONOSCOPY WITH PROPOFOL N/A 05/04/2017   Procedure: COLONOSCOPY WITH PROPOFOL;  Surgeon: Laurence Spates, MD;  Location: [facility];  Service: Endoscopy;  Laterality: N/A;   COLONOSCOPY WITH PROPOFOL  N/A 01/26/2019   Procedure: COLONOSCOPY WITH PROPOFOL;  Surgeon: Laurence Spates, MD;  Location: [facility];  Service: Endoscopy;  Laterality: N/A;   HOT HEMOSTASIS N/A 05/04/2017   Procedure: HOT HEMOSTASIS (ARGON PLASMA COAGULATION/BICAP);  Surgeon: Laurence Spates, MD;  Location: [facility];  Service: Endoscopy;  Laterality: N/A;   KNEE ARTHROSCOPY W/ ACL RECONSTRUCTION     left   KNEE ARTHROSCOPY WITH MENISCAL REPAIR Left 12/12/2020   Procedure: KNEE ARTHROSCOPY WITH MENISCAL REPAIR;  Surgeon: Thornton Park, MD;  Location: [facility];  Service: Orthopedics;  Laterality: Left;   POLYPECTOMY  01/26/2019   Procedure: POLYPECTOMY;  Surgeon: Laurence Spates, MD;  Location: [facility];  Service: Endoscopy;;   SEPTOPLASTY     SINUS SURGERY WITH INSTATRAK  05/20/2017   @ [place]   TRANSANAL EXCISION OF RECTAL MASS N/A 06/03/2017   Procedure: TRANSANAL EXCISION OF RECTAL POLYP;  Surgeon: [name], MD;  Location: [facility];  Service: General;  Laterality: N/A;   WISDOM TOOTH EXTRACTION      Social History Social History   Tobacco Use   Smoking status: Current Some Day Smoker    Types: Cigars   Smokeless tobacco: Never Used   Tobacco comment: VERY SELDOM ONLY WHEN HE PLAYS GOLF   Vaping Use   Vaping Use: Never used  Substance Use Topics   Alcohol use: Yes    Alcohol/week: 21.0 standard drinks  Types: 21 Cans of beer per week   Drug use: No    Family History Family History  Problem Relation Age of Onset   Parkinson's disease Mother    Colon polyps Mother    Heart disease Father    Colon cancer Maternal Uncle 56  No family history of bleeding/clotting disorders, porphyria or autoimmune disease   Allergies  Allergen Reactions   Tetracyclines & Related Rash     REVIEW OF SYSTEMS (Negative unless checked)  Constitutional: [] Weight loss  [] Fever  [] Chills Cardiac: [] Chest pain   [] Chest pressure   [] Palpitations   [] Shortness of breath when laying  flat   [] Shortness of breath with exertion. Vascular:  [] Pain in legs with walking   [] Pain in legs at rest  [] History of DVT   [] Phlebitis   [x] Swelling in legs   [] Varicose veins   [] Non-healing ulcers Pulmonary:   [] Uses home oxygen   [] Productive cough   [] Hemoptysis   [] Wheeze  [] COPD   [] Asthma Neurologic:  [] Dizziness   [] Seizures   [] History of stroke   [] History of TIA  [] Aphasia   [] Vissual changes   [] Weakness or numbness in arm   [] Weakness or numbness in leg Musculoskeletal:   [] Joint swelling   [x] Joint pain   [] Low back pain Hematologic:  [] Easy bruising  [] Easy bleeding   [] Hypercoagulable state   [] Anemic Gastrointestinal:  [] Diarrhea   [] Vomiting  [] Gastroesophageal reflux/heartburn   [] Difficulty swallowing. Genitourinary:  [] Chronic kidney disease   [] Difficult urination  [] Frequent urination   [] Blood in urine Skin:  [] Rashes   [] Ulcers  Psychological:  [] History of anxiety   []  History of major depression.  Physical Examination  There were no vitals filed for this visit. There is no height or weight on file to calculate BMI. Gen: WD/WN, NAD Head: [place]/AT, No temporalis wasting.  Ear/Nose/Throat: Hearing grossly intact, nares w/o erythema or drainage, poor dentition Eyes: PER, EOMI, sclera nonicteric.  Neck: Supple, no masses.  No bruit or JVD.  Pulmonary:  Good air movement, clear to auscultation bilaterally, no use of accessory muscles.  Cardiac: RRR, normal S1, S2, no Murmurs. Vascular: scattered varicosities present bilaterally.  Mild venous stasis changes to the legs bilaterally.  2+ soft pitting edema Vessel Right Left  Radial Palpable Palpable  PT Palpable Palpable  DP Palpable Palpable  Gastrointestinal: soft, non-distended. No guarding/no peritoneal signs.  Musculoskeletal: M/S 5/5 throughout.  No deformity or atrophy.  Neurologic: CN 2-12 intact. Pain and light touch intact in extremities.  Symmetrical.  Speech is fluent. Motor exam as listed  above. Psychiatric: Judgment intact, Mood & affect appropriate for pt's clinical situation. Dermatologic: No rashes or ulcers noted.  No changes consistent with cellulitis.   CBC No results found for: WBC, HGB, HCT, MCV, PLT  BMET No results found for: NA, K, CL, CO2, GLUCOSE, BUN, CREATININE, CALCIUM, GFRNONAA, GFRAA CrCl cannot be calculated (No successful lab value found.).  COAG No results found for: INR, PROTIME  Radiology MR PROSTATE W WO CONTRAST  Result Date: 12/07/2020 CLINICAL DATA:  Elevated PSA (5.6) EXAM: MR PROSTATE WITHOUT AND WITH CONTRAST TECHNIQUE: Multiplanar multisequence MRI images were obtained of the pelvis centered about the prostate. Pre and post contrast images were obtained. CONTRAST:  32mL GADAVIST GADOBUTROL 1 MMOL/ML IV SOLN COMPARISON:  None. FINDINGS: Prostate: 1.7 x 1.1 x 1.5 cm focal low T2 lesion at the extreme right posterolateral apex of the peripheral zone (series 4/image 17). Associated moderate to severe restricted diffusion/low ADC (series  7-8/image 19). Associated early arterial enhancement (series 23/image 18). 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 Enlargement/nodularity of the central gland, which indents the base of the bladder, suggesting BPH. No suspicious central gland nodule on T2. Volume: 5.5 x 4.4 x 5.0 cm (calculated volume 59.1 mL). Transcapsular spread:  Absent Seminal vesicle involvement: Absent Neurovascular bundle involvement: Absent Pelvic adenopathy: 7 mm short axis left inguinal node (series 4/image 14), likely reactive. 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 No findings suspicious for extracapsular extension, seminal vesicle invasion, lymphadenopathy, or metastatic disease. BPH.  Calculated prostate volume 59.1 mL).  Electronically Signed   By: Julian Hy M.D.   On: 12/07/2020 18:20   US Venous Img Lower Unilateral Left  Result Date: 12/14/2020 CLINICAL DATA:  Knee arthroscopy with meniscal repair on 12/12/2020. Left lower extremity pain and swelling increased since last night. EXAM: LEFT LOWER EXTREMITY VENOUS DOPPLER ULTRASOUND TECHNIQUE: Gray-scale sonography with compression, as well as color and duplex ultrasound, were performed to evaluate the deep venous system(s) from the level of the common femoral vein through the popliteal and proximal calf veins. COMPARISON:  None. FINDINGS: VENOUS Normal compressibility of the common femoral, superficial femoral, and popliteal veins. No filling defects to suggest DVT on grayscale or color Doppler imaging. Doppler waveforms show normal direction of venous flow, normal respiratory plasticity and response to augmentation. Questionable thrombus in the posterior tibial vein, not well visualized. Peroneal vein widely patent. Visualized portions of profunda femoral vein and great saphenous vein unremarkable. Limited views of the contralateral common femoral vein are unremarkable. OTHER Small complex popliteal cyst measuring 3.4 x 1.4 x 1.6 cm. Limitations: none IMPRESSION: 1. Possible, but not conclusive, thrombus in the posterior tibial vein. No other findings to suggest deep venous thrombosis of the left lower extremity. Electronically Signed   By: Lajean Manes M.D.   On: 12/14/2020 09:48    Assessment/Plan 1. Acute deep vein thrombosis (DVT) of tibial vein of left lower extremity (HCC) Recommend:   No surgery or intervention at this point in time.  IVC filter is not indicated at present.  Patient's duplex ultrasound of the venous system shows resolution of the tibial DVT.  The patient is initiated on anticoagulation and will continue this through January 19, 2021.  Elevation was stressed, use of a recliner was discussed.  I have had a long discussion with the  patient regarding DVT and post phlebitic changes such as swelling and why it  causes symptoms such as pain.  The patient will wear graduated compression stockings. The patient will  beginning wearing the stockings first thing in the morning and removing them in the evening. The patient is instructed specifically not to sleep in the stockings.  In addition, behavioral modification including elevation during the day and avoidance of prolonged dependency will be initiated.       Hortencia Pilar, MD  12/25/2020 7:39 AM

## 2020-12-26 ENCOUNTER — Other Ambulatory Visit: Payer: Self-pay

## 2020-12-26 ENCOUNTER — Ambulatory Visit (INDEPENDENT_AMBULATORY_CARE_PROVIDER_SITE_OTHER): Payer: 59

## 2020-12-26 ENCOUNTER — Ambulatory Visit (INDEPENDENT_AMBULATORY_CARE_PROVIDER_SITE_OTHER): Payer: 59 | Admitting: Vascular Surgery

## 2020-12-26 ENCOUNTER — Encounter (INDEPENDENT_AMBULATORY_CARE_PROVIDER_SITE_OTHER): Payer: Self-pay | Admitting: Vascular Surgery

## 2020-12-26 DIAGNOSIS — M7989 Other specified soft tissue disorders: Secondary | ICD-10-CM

## 2020-12-26 DIAGNOSIS — I82449 Acute embolism and thrombosis of unspecified tibial vein: Secondary | ICD-10-CM | POA: Insufficient documentation

## 2020-12-26 DIAGNOSIS — I82442 Acute embolism and thrombosis of left tibial vein: Secondary | ICD-10-CM | POA: Diagnosis not present

## 2020-12-26 DIAGNOSIS — R600 Localized edema: Secondary | ICD-10-CM

## 2020-12-27 ENCOUNTER — Other Ambulatory Visit: Payer: Self-pay

## 2020-12-27 DIAGNOSIS — Z0000 Encounter for general adult medical examination without abnormal findings: Secondary | ICD-10-CM | POA: Diagnosis not present

## 2020-12-27 MED ORDER — OXYCODONE HCL 5 MG PO TABS
5.0000 mg | ORAL_TABLET | Freq: Four times a day (QID) | ORAL | 0 refills | Status: DC | PRN
  Filled 2020-12-27: qty 16, 2d supply, fill #0

## 2021-01-01 DIAGNOSIS — M25662 Stiffness of left knee, not elsewhere classified: Secondary | ICD-10-CM | POA: Diagnosis not present

## 2021-01-01 DIAGNOSIS — M25562 Pain in left knee: Secondary | ICD-10-CM | POA: Diagnosis not present

## 2021-01-03 ENCOUNTER — Other Ambulatory Visit: Payer: Self-pay

## 2021-01-03 DIAGNOSIS — M25562 Pain in left knee: Secondary | ICD-10-CM | POA: Diagnosis not present

## 2021-01-03 DIAGNOSIS — M25662 Stiffness of left knee, not elsewhere classified: Secondary | ICD-10-CM | POA: Diagnosis not present

## 2021-01-03 MED ORDER — OXYCODONE HCL 10 MG PO TABS
ORAL_TABLET | ORAL | 0 refills | Status: DC
  Filled 2021-01-03: qty 30, 10d supply, fill #0

## 2021-01-05 MED FILL — Telmisartan Tab 80 MG: ORAL | 90 days supply | Qty: 90 | Fill #0 | Status: AC

## 2021-01-05 MED FILL — Alprazolam Tab 1 MG: ORAL | 30 days supply | Qty: 60 | Fill #2 | Status: AC

## 2021-01-06 ENCOUNTER — Other Ambulatory Visit: Payer: Self-pay

## 2021-01-07 ENCOUNTER — Other Ambulatory Visit: Payer: Self-pay

## 2021-01-07 MED ORDER — LEVOFLOXACIN 750 MG PO TABS
ORAL_TABLET | ORAL | 0 refills | Status: DC
  Filled 2021-01-07: qty 1, 1d supply, fill #0

## 2021-01-07 MED ORDER — DIAZEPAM 10 MG PO TABS
ORAL_TABLET | ORAL | 0 refills | Status: DC
  Filled 2021-01-07: qty 1, 1d supply, fill #0

## 2021-01-09 DIAGNOSIS — M25662 Stiffness of left knee, not elsewhere classified: Secondary | ICD-10-CM | POA: Diagnosis not present

## 2021-01-09 DIAGNOSIS — M25562 Pain in left knee: Secondary | ICD-10-CM | POA: Diagnosis not present

## 2021-01-13 DIAGNOSIS — M25562 Pain in left knee: Secondary | ICD-10-CM | POA: Diagnosis not present

## 2021-01-13 DIAGNOSIS — M25662 Stiffness of left knee, not elsewhere classified: Secondary | ICD-10-CM | POA: Diagnosis not present

## 2021-01-14 DIAGNOSIS — F4321 Adjustment disorder with depressed mood: Secondary | ICD-10-CM | POA: Diagnosis not present

## 2021-01-15 DIAGNOSIS — M25562 Pain in left knee: Secondary | ICD-10-CM | POA: Diagnosis not present

## 2021-01-15 DIAGNOSIS — M25662 Stiffness of left knee, not elsewhere classified: Secondary | ICD-10-CM | POA: Diagnosis not present

## 2021-01-23 DIAGNOSIS — M25562 Pain in left knee: Secondary | ICD-10-CM | POA: Diagnosis not present

## 2021-01-23 DIAGNOSIS — M25662 Stiffness of left knee, not elsewhere classified: Secondary | ICD-10-CM | POA: Diagnosis not present

## 2021-01-24 DIAGNOSIS — R972 Elevated prostate specific antigen [PSA]: Secondary | ICD-10-CM | POA: Diagnosis not present

## 2021-01-27 DIAGNOSIS — M25662 Stiffness of left knee, not elsewhere classified: Secondary | ICD-10-CM | POA: Diagnosis not present

## 2021-01-27 DIAGNOSIS — M25562 Pain in left knee: Secondary | ICD-10-CM | POA: Diagnosis not present

## 2021-01-29 ENCOUNTER — Other Ambulatory Visit: Payer: Self-pay | Admitting: Urology

## 2021-01-29 DIAGNOSIS — M25662 Stiffness of left knee, not elsewhere classified: Secondary | ICD-10-CM | POA: Diagnosis not present

## 2021-01-29 DIAGNOSIS — M25562 Pain in left knee: Secondary | ICD-10-CM | POA: Diagnosis not present

## 2021-01-30 ENCOUNTER — Other Ambulatory Visit: Payer: Self-pay

## 2021-01-30 MED ORDER — SCOPOLAMINE 1 MG/3DAYS TD PT72
MEDICATED_PATCH | TRANSDERMAL | 1 refills | Status: DC
  Filled 2021-01-30: qty 4, 12d supply, fill #0

## 2021-02-01 MED FILL — Eluxadoline Tab 100 MG: ORAL | 30 days supply | Qty: 60 | Fill #0 | Status: CN

## 2021-02-03 ENCOUNTER — Other Ambulatory Visit: Payer: Self-pay

## 2021-02-04 ENCOUNTER — Other Ambulatory Visit: Payer: Self-pay

## 2021-02-04 MED FILL — Eluxadoline Tab 100 MG: ORAL | 30 days supply | Qty: 60 | Fill #0 | Status: AC

## 2021-02-06 ENCOUNTER — Other Ambulatory Visit: Payer: Self-pay

## 2021-02-07 ENCOUNTER — Other Ambulatory Visit: Payer: Self-pay

## 2021-02-07 ENCOUNTER — Encounter: Payer: Self-pay | Admitting: Urology

## 2021-02-07 MED ORDER — ONDANSETRON 8 MG PO TBDP
ORAL_TABLET | ORAL | 0 refills | Status: DC
  Filled 2021-02-07: qty 20, 7d supply, fill #0

## 2021-02-07 MED ORDER — ALPRAZOLAM 1 MG PO TABS
ORAL_TABLET | Freq: Two times a day (BID) | ORAL | 3 refills | Status: DC
  Filled 2021-02-07: qty 60, 30d supply, fill #0
  Filled 2021-03-08: qty 60, 30d supply, fill #1
  Filled 2021-04-09 – 2021-04-10 (×2): qty 60, 30d supply, fill #2
  Filled 2021-05-11: qty 60, 30d supply, fill #3

## 2021-02-10 ENCOUNTER — Other Ambulatory Visit: Payer: Self-pay | Admitting: Urology

## 2021-02-10 MED ORDER — TADALAFIL 20 MG PO TABS: ORAL_TABLET | ORAL | 1 refills | Status: DC

## 2021-02-11 ENCOUNTER — Other Ambulatory Visit: Payer: Self-pay

## 2021-02-11 MED ORDER — CELECOXIB 200 MG PO CAPS
200.0000 mg | ORAL_CAPSULE | Freq: Every day | ORAL | 2 refills | Status: DC
  Filled 2021-02-11: qty 30, 30d supply, fill #0

## 2021-03-05 DIAGNOSIS — G4733 Obstructive sleep apnea (adult) (pediatric): Secondary | ICD-10-CM | POA: Diagnosis not present

## 2021-03-07 ENCOUNTER — Other Ambulatory Visit: Payer: Self-pay

## 2021-03-07 MED ORDER — MELOXICAM 7.5 MG PO TABS
ORAL_TABLET | ORAL | 3 refills | Status: DC
  Filled 2021-03-07: qty 90, 45d supply, fill #0

## 2021-03-10 ENCOUNTER — Other Ambulatory Visit: Payer: Self-pay

## 2021-04-04 DIAGNOSIS — F4321 Adjustment disorder with depressed mood: Secondary | ICD-10-CM | POA: Diagnosis not present

## 2021-04-09 MED FILL — Telmisartan Tab 80 MG: ORAL | 90 days supply | Qty: 90 | Fill #1 | Status: CN

## 2021-04-10 ENCOUNTER — Other Ambulatory Visit: Payer: Self-pay

## 2021-04-10 MED FILL — Telmisartan Tab 80 MG: ORAL | 90 days supply | Qty: 90 | Fill #1 | Status: AC

## 2021-05-12 ENCOUNTER — Other Ambulatory Visit: Payer: Self-pay

## 2021-05-16 ENCOUNTER — Other Ambulatory Visit: Payer: Self-pay

## 2021-05-16 MED ORDER — OXYCODONE HCL 10 MG PO TABS
ORAL_TABLET | ORAL | 0 refills | Status: DC
  Filled 2021-05-16: qty 30, 7d supply, fill #0

## 2021-05-22 ENCOUNTER — Other Ambulatory Visit: Payer: Self-pay | Admitting: Urology

## 2021-05-22 DIAGNOSIS — R972 Elevated prostate specific antigen [PSA]: Secondary | ICD-10-CM

## 2021-05-26 ENCOUNTER — Other Ambulatory Visit: Payer: Self-pay

## 2021-05-26 MED ORDER — OXYCODONE HCL 10 MG PO TABS
ORAL_TABLET | ORAL | 0 refills | Status: DC
  Filled 2021-05-26: qty 30, 8d supply, fill #0

## 2021-06-01 ENCOUNTER — Other Ambulatory Visit: Payer: Self-pay | Admitting: Orthopedic Surgery

## 2021-06-01 DIAGNOSIS — Z01818 Encounter for other preprocedural examination: Secondary | ICD-10-CM

## 2021-06-03 ENCOUNTER — Other Ambulatory Visit: Payer: Self-pay

## 2021-06-03 DIAGNOSIS — R972 Elevated prostate specific antigen [PSA]: Secondary | ICD-10-CM | POA: Diagnosis not present

## 2021-06-03 DIAGNOSIS — Z01818 Encounter for other preprocedural examination: Secondary | ICD-10-CM | POA: Diagnosis not present

## 2021-06-03 DIAGNOSIS — I10 Essential (primary) hypertension: Secondary | ICD-10-CM | POA: Diagnosis not present

## 2021-06-03 MED ORDER — OXYCODONE HCL 10 MG PO TABS
10.0000 mg | ORAL_TABLET | Freq: Four times a day (QID) | ORAL | 0 refills | Status: DC | PRN
  Filled 2021-06-03: qty 30, 8d supply, fill #0

## 2021-06-04 ENCOUNTER — Inpatient Hospital Stay
Admission: RE | Admit: 2021-06-04 | Discharge: 2021-06-04 | Disposition: A | Discharge status: Home / Self Care | Payer: 59 | Source: Ambulatory Visit

## 2021-06-04 NOTE — Patient Instructions (Signed)
Your procedure is scheduled on: Thursday 06/19/21 Report to the Registration Desk on the 1st floor of the [facility]. To find out your arrival time, please call [phone] between 1PM - 3PM on: Wednesday 06/18/21  REMEMBER: Instructions that are not followed completely may result in serious medical risk, up to and including death; or upon the discretion of your surgeon and anesthesiologist your surgery may need to be rescheduled.  Do not eat food after midnight the night before surgery.  No gum chewing, lozengers or hard candies.  TAKE THESE MEDICATIONS THE MORNING OF SURGERY WITH A SIP OF WATER: ondansetron (ZOFRAN-ODT) 8 MG disintegrating tablet  One week prior to surgery: Stop Anti-inflammatories (NSAIDS) such as Advil, Aleve, Ibuprofen, Motrin, Naproxen, Naprosyn and Aspirin based products such as Excedrin, Goodys Powder, BC Powder. Stop ANY OVER THE COUNTER supplements until after surgery. You may however, continue to take Tylenol if needed for pain up until the day of surgery.  No Alcohol for 24 hours before or after surgery.  No Smoking including e-cigarettes for 24 hours prior to surgery.  No chewable tobacco products for at least 6 hours prior to surgery.  No nicotine patches on the day of surgery.  Do not use any "recreational" drugs for at least a week prior to your surgery.  Please be advised that the combination of cocaine and anesthesia may have negative outcomes, up to and including death. If you test positive for cocaine, your surgery will be cancelled.  On the morning of surgery brush your teeth with toothpaste and water, you may rinse your mouth with mouthwash if you wish. Do not swallow any toothpaste or mouthwash.  Use CHG wipes as directed on instruction sheet.  Do not wear jewelry..  Do not wear lotions, powders, or colognes.   Do not shave body from the neck down 48 hours prior to surgery just in case you cut yourself which could leave a site for  infection.  Also, freshly shaved skin may become irritated if using the CHG soap.  Contact lenses, hearing aids and dentures may not be worn into surgery.  Do not bring valuables to the hospital. [facility] is not responsible for any missing/lost belongings or valuables.   Notify your doctor if there is any change in your medical condition (cold, fever, infection).  Wear comfortable clothing (specific to your surgery type) to the hospital.  After surgery, you can help prevent lung complications by doing breathing exercises.  Take deep breaths and cough every 1-2 hours.   If you are being admitted to the hospital overnight, leave your suitcase in the car. After surgery it may be brought to your room.  If you are taking public transportation, you will need to have a responsible adult (18 years or older) with you. Please confirm with your physician that it is acceptable to use public transportation.   Please call the [facility] at [phone] if you have any questions about these instructions.  Surgery Visitation Policy:  Patients undergoing a surgery or procedure may have one family member or support person with them as long as that person is not COVID-19 positive or experiencing its symptoms.  That person may remain in the waiting area during the procedure and may rotate out with other people.  Inpatient Visitation:    Visiting hours are 7 a.m. to 8 p.m. Up to two visitors ages 16+ are allowed at one time in a patient room. The visitors may rotate out with other people  during the day. Visitors must check out when they leave, or other visitors will not be allowed. One designated support person may remain overnight. The visitor must pass COVID-19 screenings, use hand sanitizer when entering and exiting the patient's room and wear a mask at all times, including in the patient's room. Patients must also wear a mask when staff or their visitor are in the room. Masking  is required regardless of vaccination status.

## 2021-06-06 ENCOUNTER — Other Ambulatory Visit: Payer: Self-pay

## 2021-06-06 ENCOUNTER — Encounter
Admission: RE | Admit: 2021-06-06 | Discharge: 2021-06-06 | Disposition: A | Discharge status: Home / Self Care | Payer: 59 | Source: Ambulatory Visit | Attending: Orthopedic Surgery | Admitting: Orthopedic Surgery

## 2021-06-06 DIAGNOSIS — Z01812 Encounter for preprocedural laboratory examination: Secondary | ICD-10-CM | POA: Diagnosis not present

## 2021-06-06 DIAGNOSIS — Z01818 Encounter for other preprocedural examination: Secondary | ICD-10-CM

## 2021-06-06 HISTORY — DX: Anxiety disorder, unspecified: F41.9

## 2021-06-06 HISTORY — DX: Acute embolism and thrombosis of unspecified deep veins of unspecified lower extremity: I82.409

## 2021-06-06 LAB — BASIC METABOLIC PANEL
Anion gap: 8 (ref 5–15)
BUN: 16 mg/dL (ref 8–23)
CO2: 29 mmol/L (ref 22–32)
Calcium: 9.1 mg/dL (ref 8.9–10.3)
Chloride: 101 mmol/L (ref 98–111)
Creatinine, Ser: 1.03 mg/dL (ref 0.61–1.24)
GFR, Estimated: >60 mL/min (ref >60)
Glucose, Bld: 86 mg/dL (ref 70–99)
Potassium: 3.8 mmol/L (ref 3.5–5.1)
Sodium: 138 mmol/L (ref 135–145)

## 2021-06-06 LAB — URINALYSIS, ROUTINE W REFLEX MICROSCOPIC
Bacteria, UA: NONE SEEN
Bilirubin Urine: NEGATIVE
Glucose, UA: NEGATIVE mg/dL
Hgb urine dipstick: NEGATIVE
Ketones, ur: NEGATIVE mg/dL
Nitrite: NEGATIVE
Protein, ur: NEGATIVE mg/dL
Specific Gravity, Urine: 1.004 — ABNORMAL LOW (ref 1.005–1.030)
Squamous Epithelial / HPF: NONE SEEN (ref 0–5)
pH: 7 (ref 5.0–8.0)

## 2021-06-06 LAB — CBC WITH DIFFERENTIAL/PLATELET
Abs Immature Granulocytes: 0.02 10*3/uL (ref 0.00–0.07)
Basophils Absolute: 0.1 10*3/uL (ref 0.0–0.1)
Basophils Relative: 2 %
Eosinophils Absolute: 0.2 10*3/uL (ref 0.0–0.5)
Eosinophils Relative: 4 %
HCT: 42.9 % (ref 39.0–52.0)
Hemoglobin: 14.3 g/dL (ref 13.0–17.0)
Immature Granulocytes: 0 %
Lymphocytes Relative: 23 %
Lymphs Abs: 1.4 10*3/uL (ref 0.7–4.0)
MCH: 31.2 pg (ref 26.0–34.0)
MCHC: 33.3 g/dL (ref 30.0–36.0)
MCV: 93.5 fL (ref 80.0–100.0)
Monocytes Absolute: 0.5 10*3/uL (ref 0.1–1.0)
Monocytes Relative: 8 %
Neutro Abs: 3.8 10*3/uL (ref 1.7–7.7)
Neutrophils Relative %: 63 %
Platelets: 278 10*3/uL (ref 150–400)
RBC: 4.59 MIL/uL (ref 4.22–5.81)
RDW: 12.6 % (ref 11.5–15.5)
WBC: 6 10*3/uL (ref 4.0–10.5)
nRBC: 0 % (ref 0.0–0.2)

## 2021-06-06 LAB — SURGICAL PCR SCREEN
MRSA, PCR: NEGATIVE
Staphylococcus aureus: NEGATIVE

## 2021-06-06 LAB — TYPE AND SCREEN
ABO/RH(D): O NEG
Antibody Screen: NEGATIVE

## 2021-06-06 LAB — PROTIME-INR
INR: 1 (ref 0.8–1.2)
Prothrombin Time: 13.3 seconds (ref 11.4–15.2)

## 2021-06-06 LAB — APTT: aPTT: 26 seconds (ref 24–36)

## 2021-06-06 NOTE — Pre-Procedure Instructions (Signed)
Patient confirms he is scheduled for left total knee replacement with Dr Mack Guise 06/19/21. Consents signed.

## 2021-06-06 NOTE — Patient Instructions (Addendum)
Your procedure is scheduled on: 06/19/21 Thurs Report to [place]. To find out your arrival time please call [phone] between 1PM - 3PM on 06/18/21 Wed.  Remember: Instructions that are not followed completely may result in serious medical risk, up to and including death, or upon the discretion of your surgeon and anesthesiologist your surgery may need to be rescheduled.     _X__ 1. Do not eat food or drink any liquids after midnight the night before your procedure.                 No gum chewing or hard candies.   __X__2.  On the morning of surgery brush your teeth with toothpaste and water, you                 may rinse your mouth with mouthwash if you wish.  Do not swallow any              toothpaste of mouthwash.     _X__ 3.  No Alcohol for 24 hours before or after surgery.   _X__ 4.  Do Not Smoke or use e-cigarettes For 24 Hours Prior to Your Surgery.                 Do not use any chewable tobacco products for at least 6 hours prior to                 surgery.  ____  5.  Bring all medications with you on the day of surgery if instructed.   __X__  6.  Notify your doctor if there is any change in your medical condition      (cold, fever, infections).     Do not wear jewelry, make-up, hairpins, clips or nail polish. Do not wear lotions, powders, or perfumes.  Do not shave body hair 48 hours prior to surgery. Men may shave face and neck. Do not bring valuables to the hospital.    [facility] is not responsible for any belongings or valuables.  Contacts, dentures/partials or body piercings may not be worn into surgery. Bring a case for your contacts, glasses or hearing aids, a denture cup will be supplied. Leave your suitcase in the car. After surgery it may be brought to your room. For patients admitted to the hospital, discharge time is determined by your treatment team.   Patients discharged the day of surgery will  not be allowed to drive home.   Please read over the following fact sheets that you were given:   MRSA Information, CHG soap, Emmi video  __X__ Take these medicines the morning of surgery with A SIP OF WATER:    1. May take oxycodone if needed for pain the morning of surgery  2.   3.   4.  5.  6.  ____ Fleet Enema (as directed)   __X__ Use CHG Soap/SAGE wipes as directed  ____ Use inhalers on the day of surgery  ____ Stop metformin/Janumet/Farxiga 2 days prior to surgery    ____ Take 1/2 of usual insulin dose the night before surgery. No insulin the morning          of surgery.   ____ Stop Blood Thinners Coumadin/Plavix/Xarelto/Pleta/Pradaxa/Eliquis/Effient/Aspirin  on   Or contact your Surgeon, Cardiologist or Medical Doctor regarding  ability to stop your blood thinners  __X__ Stop Anti-inflammatories 7 days before surgery such as Advil, Ibuprofen, Motrin,  BC or Goodies  Powder, Naprosyn, Naproxen, Aleve, Aspirin    __X__ Stop all herbals or supplements, fish oil or vitamins  until after surgery.    __X__ Bring C-Pap to the hospital.

## 2021-06-10 ENCOUNTER — Other Ambulatory Visit: Payer: Self-pay

## 2021-06-11 ENCOUNTER — Other Ambulatory Visit: Payer: Self-pay

## 2021-06-11 MED ORDER — OXYCODONE HCL 10 MG PO TABS
ORAL_TABLET | ORAL | 0 refills | Status: DC
  Filled 2021-06-13: qty 30, 8d supply, fill #0

## 2021-06-13 ENCOUNTER — Other Ambulatory Visit: Payer: Self-pay

## 2021-06-16 ENCOUNTER — Other Ambulatory Visit: Payer: 59

## 2021-06-17 ENCOUNTER — Other Ambulatory Visit: Admission: RE | Admit: 2021-06-17 | Payer: 59 | Source: Ambulatory Visit

## 2021-06-17 MED ORDER — LIDOCAINE HCL (PF) 2 % IJ SOLN
INTRAMUSCULAR | Status: AC
  Filled 2021-06-17: qty 5

## 2021-06-17 MED ORDER — FENTANYL CITRATE (PF) 100 MCG/2ML IJ SOLN
INTRAMUSCULAR | Status: AC
  Filled 2021-06-17: qty 2

## 2021-06-17 MED ORDER — EPHEDRINE 5 MG/ML INJ
INTRAVENOUS | Status: AC
  Filled 2021-06-17: qty 5

## 2021-06-17 MED ORDER — ONDANSETRON HCL 4 MG/2ML IJ SOLN
INTRAMUSCULAR | Status: AC
  Filled 2021-06-17: qty 2

## 2021-06-17 MED ORDER — DEXAMETHASONE SODIUM PHOSPHATE 10 MG/ML IJ SOLN
INTRAMUSCULAR | Status: AC
  Filled 2021-06-17: qty 1

## 2021-06-17 MED ORDER — MIDAZOLAM HCL 2 MG/2ML IJ SOLN
INTRAMUSCULAR | Status: AC
  Filled 2021-06-17: qty 2

## 2021-06-17 MED ORDER — PROPOFOL 10 MG/ML IV BOLUS
INTRAVENOUS | Status: AC
  Filled 2021-06-17: qty 20

## 2021-06-18 ENCOUNTER — Other Ambulatory Visit: Payer: Self-pay

## 2021-06-18 ENCOUNTER — Other Ambulatory Visit
Admission: RE | Admit: 2021-06-18 | Discharge: 2021-06-18 | Disposition: A | Discharge status: Home / Self Care | Payer: 59 | Source: Ambulatory Visit | Attending: Orthopedic Surgery | Admitting: Orthopedic Surgery

## 2021-06-18 DIAGNOSIS — Z20822 Contact with and (suspected) exposure to covid-19: Secondary | ICD-10-CM | POA: Insufficient documentation

## 2021-06-18 DIAGNOSIS — Z01812 Encounter for preprocedural laboratory examination: Secondary | ICD-10-CM | POA: Diagnosis not present

## 2021-06-19 ENCOUNTER — Encounter
Admission: RE | Disposition: A | Discharge status: Home / Self Care | Payer: Self-pay | Source: Ambulatory Visit | Attending: Orthopedic Surgery

## 2021-06-19 ENCOUNTER — Ambulatory Visit: Payer: 59 | Admitting: Urgent Care

## 2021-06-19 ENCOUNTER — Ambulatory Visit: Payer: 59 | Admitting: Certified Registered Nurse Anesthetist

## 2021-06-19 ENCOUNTER — Encounter: Payer: Self-pay | Admitting: Orthopedic Surgery

## 2021-06-19 ENCOUNTER — Ambulatory Visit: Payer: 59

## 2021-06-19 ENCOUNTER — Observation Stay
Admission: RE | Admit: 2021-06-19 | Discharge: 2021-06-21 | Disposition: A | Discharge status: Home / Self Care | Payer: 59 | Source: Ambulatory Visit | Attending: Orthopedic Surgery | Admitting: Orthopedic Surgery

## 2021-06-19 ENCOUNTER — Other Ambulatory Visit: Payer: Self-pay

## 2021-06-19 DIAGNOSIS — I9742 Intraoperative hemorrhage and hematoma of a circulatory system organ or structure complicating other procedure: Secondary | ICD-10-CM | POA: Diagnosis not present

## 2021-06-19 DIAGNOSIS — F32A Depression, unspecified: Secondary | ICD-10-CM | POA: Diagnosis not present

## 2021-06-19 DIAGNOSIS — I9788 Other intraoperative complications of the circulatory system, not elsewhere classified: Secondary | ICD-10-CM | POA: Diagnosis not present

## 2021-06-19 DIAGNOSIS — Z96652 Presence of left artificial knee joint: Secondary | ICD-10-CM | POA: Diagnosis not present

## 2021-06-19 DIAGNOSIS — F17290 Nicotine dependence, other tobacco product, uncomplicated: Secondary | ICD-10-CM | POA: Diagnosis not present

## 2021-06-19 DIAGNOSIS — I10 Essential (primary) hypertension: Secondary | ICD-10-CM | POA: Diagnosis not present

## 2021-06-19 DIAGNOSIS — M1712 Unilateral primary osteoarthritis, left knee: Secondary | ICD-10-CM | POA: Diagnosis not present

## 2021-06-19 DIAGNOSIS — Z79899 Other long term (current) drug therapy: Secondary | ICD-10-CM | POA: Diagnosis not present

## 2021-06-19 HISTORY — PX: TOTAL KNEE ARTHROPLASTY: SHX125

## 2021-06-19 HISTORY — PX: ARTERY REPAIR: SHX5117

## 2021-06-19 LAB — ABO/RH: ABO/RH(D): O NEG

## 2021-06-19 LAB — SARS CORONAVIRUS 2 (TAT 6-24 HRS): SARS Coronavirus 2: NEGATIVE

## 2021-06-19 SURGERY — ARTHROPLASTY, KNEE, TOTAL
Anesthesia: General | Site: Knee | Laterality: Left

## 2021-06-19 MED ORDER — ACETAMINOPHEN 500 MG PO TABS
1000.0000 mg | ORAL_TABLET | Freq: Four times a day (QID) | ORAL | Status: AC
  Administered 2021-06-19 – 2021-06-20 (×3): 1000 mg via ORAL
  Administered 2021-06-20: 500 mg via ORAL
  Filled 2021-06-19 (×4): qty 2

## 2021-06-19 MED ORDER — OXYCODONE HCL 5 MG PO TABS
5.0000 mg | ORAL_TABLET | Freq: Once | ORAL | Status: AC | PRN
  Administered 2021-06-19: 5 mg via ORAL

## 2021-06-19 MED ORDER — HYDROMORPHONE HCL 1 MG/ML IJ SOLN
INTRAMUSCULAR | Status: AC
  Administered 2021-06-19: 0.5 mg via INTRAVENOUS
  Filled 2021-06-19: qty 1

## 2021-06-19 MED ORDER — METHOCARBAMOL 1000 MG/10ML IJ SOLN
500.0000 mg | Freq: Four times a day (QID) | INTRAVENOUS | Status: DC | PRN
  Filled 2021-06-19: qty 5

## 2021-06-19 MED ORDER — AMLODIPINE BESYLATE 5 MG PO TABS
5.0000 mg | ORAL_TABLET | Freq: Every day | ORAL | Status: DC
  Administered 2021-06-19 – 2021-06-21 (×3): 5 mg via ORAL
  Filled 2021-06-19 (×3): qty 1

## 2021-06-19 MED ORDER — FENTANYL CITRATE (PF) 100 MCG/2ML IJ SOLN
INTRAMUSCULAR | Status: DC | PRN
  Administered 2021-06-19 (×4): 50 ug via INTRAVENOUS

## 2021-06-19 MED ORDER — METHOCARBAMOL 500 MG PO TABS
500.0000 mg | ORAL_TABLET | Freq: Four times a day (QID) | ORAL | Status: DC | PRN
  Administered 2021-06-19 – 2021-06-21 (×5): 500 mg via ORAL
  Filled 2021-06-19 (×4): qty 1

## 2021-06-19 MED ORDER — FENTANYL CITRATE (PF) 100 MCG/2ML IJ SOLN
INTRAMUSCULAR | Status: AC
  Filled 2021-06-19: qty 2

## 2021-06-19 MED ORDER — PHENYLEPHRINE HCL-NACL 20-0.9 MG/250ML-% IV SOLN
INTRAVENOUS | Status: AC
  Filled 2021-06-19: qty 250

## 2021-06-19 MED ORDER — FENTANYL CITRATE (PF) 100 MCG/2ML IJ SOLN
INTRAMUSCULAR | Status: AC
  Administered 2021-06-19: 50 ug via INTRAVENOUS
  Filled 2021-06-19: qty 2

## 2021-06-19 MED ORDER — OXYCODONE HCL 5 MG PO TABS
10.0000 mg | ORAL_TABLET | ORAL | Status: DC | PRN
  Administered 2021-06-19 – 2021-06-21 (×8): 15 mg via ORAL
  Filled 2021-06-19: qty 3
  Filled 2021-06-19: qty 2
  Filled 2021-06-19 (×6): qty 3
  Filled 2021-06-19: qty 2
  Filled 2021-06-19: qty 3

## 2021-06-19 MED ORDER — PROPOFOL 10 MG/ML IV BOLUS
INTRAVENOUS | Status: DC | PRN
  Administered 2021-06-19: 10 mg via INTRAVENOUS
  Administered 2021-06-19: 40 mg via INTRAVENOUS
  Administered 2021-06-19: 140 mg via INTRAVENOUS

## 2021-06-19 MED ORDER — SODIUM CHLORIDE (PF) 0.9 % IJ SOLN
INTRAMUSCULAR | Status: DC | PRN
  Administered 2021-06-19: 60 mL via INTRAMUSCULAR

## 2021-06-19 MED ORDER — CLINDAMYCIN PHOSPHATE 600 MG/50ML IV SOLN
600.0000 mg | Freq: Once | INTRAVENOUS | Status: AC
  Administered 2021-06-19: 600 mg via INTRAVENOUS

## 2021-06-19 MED ORDER — FAMOTIDINE 20 MG PO TABS: 20.0000 mg | ORAL_TABLET | Freq: Once | ORAL | Status: AC

## 2021-06-19 MED ORDER — PROPOFOL 10 MG/ML IV BOLUS
INTRAVENOUS | Status: AC
  Filled 2021-06-19: qty 20

## 2021-06-19 MED ORDER — ONDANSETRON HCL 4 MG/2ML IJ SOLN
4.0000 mg | Freq: Four times a day (QID) | INTRAMUSCULAR | Status: DC | PRN
  Administered 2021-06-19 – 2021-06-20 (×4): 4 mg via INTRAVENOUS
  Filled 2021-06-19 (×4): qty 2

## 2021-06-19 MED ORDER — ACETAMINOPHEN 325 MG PO TABS
325.0000 mg | ORAL_TABLET | Freq: Four times a day (QID) | ORAL | Status: DC | PRN
  Filled 2021-06-19: qty 2

## 2021-06-19 MED ORDER — ACETAMINOPHEN 500 MG PO TABS: 1000.0000 mg | ORAL_TABLET | ORAL | Status: AC

## 2021-06-19 MED ORDER — TRANEXAMIC ACID-NACL 1000-0.7 MG/100ML-% IV SOLN
INTRAVENOUS | Status: AC
  Filled 2021-06-19: qty 100

## 2021-06-19 MED ORDER — MORPHINE SULFATE (PF) 4 MG/ML IV SOLN
INTRAVENOUS | Status: AC
  Filled 2021-06-19: qty 1

## 2021-06-19 MED ORDER — SODIUM CHLORIDE 0.9 % IR SOLN
Status: DC | PRN
  Administered 2021-06-19 (×2): 1000 mL
  Administered 2021-06-19: 3000 mL

## 2021-06-19 MED ORDER — OXYCODONE HCL 5 MG PO TABS
ORAL_TABLET | ORAL | Status: AC
  Filled 2021-06-19: qty 1

## 2021-06-19 MED ORDER — CEFAZOLIN SODIUM-DEXTROSE 2-4 GM/100ML-% IV SOLN
2.0000 g | Freq: Four times a day (QID) | INTRAVENOUS | Status: AC
  Administered 2021-06-19 (×2): 2 g via INTRAVENOUS
  Filled 2021-06-19 (×2): qty 100

## 2021-06-19 MED ORDER — ORAL CARE MOUTH RINSE: 15.0000 mL | Freq: Once | OROMUCOSAL | Status: AC

## 2021-06-19 MED ORDER — PROPOFOL 1000 MG/100ML IV EMUL
INTRAVENOUS | Status: AC
  Filled 2021-06-19: qty 100

## 2021-06-19 MED ORDER — MAGNESIUM HYDROXIDE 400 MG/5ML PO SUSP: 30.0000 mL | Freq: Every day | ORAL | Status: DC | PRN

## 2021-06-19 MED ORDER — OXYCODONE HCL 5 MG/5ML PO SOLN: 5.0000 mg | Freq: Once | ORAL | Status: AC | PRN

## 2021-06-19 MED ORDER — HEMOSTATIC AGENTS (NO CHARGE) OPTIME
TOPICAL | Status: DC | PRN
  Administered 2021-06-19: 1 via TOPICAL

## 2021-06-19 MED ORDER — METHOCARBAMOL 500 MG PO TABS
ORAL_TABLET | ORAL | Status: AC
  Filled 2021-06-19: qty 1

## 2021-06-19 MED ORDER — MIDAZOLAM HCL 2 MG/2ML IJ SOLN
INTRAMUSCULAR | Status: AC
  Filled 2021-06-19: qty 2

## 2021-06-19 MED ORDER — HYDROMORPHONE HCL 1 MG/ML IJ SOLN
0.2500 mg | INTRAMUSCULAR | Status: DC | PRN
  Administered 2021-06-19 (×2): 0.5 mg via INTRAVENOUS

## 2021-06-19 MED ORDER — FAMOTIDINE 20 MG PO TABS
ORAL_TABLET | ORAL | Status: AC
  Administered 2021-06-19: 20 mg via ORAL
  Filled 2021-06-19: qty 1

## 2021-06-19 MED ORDER — LIDOCAINE HCL (PF) 2 % IJ SOLN
INTRAMUSCULAR | Status: AC
  Filled 2021-06-19: qty 5

## 2021-06-19 MED ORDER — CHLORHEXIDINE GLUCONATE CLOTH 2 % EX PADS
6.0000 | MEDICATED_PAD | Freq: Once | CUTANEOUS | Status: AC
  Administered 2021-06-19: 6 via TOPICAL

## 2021-06-19 MED ORDER — ROCURONIUM BROMIDE 100 MG/10ML IV SOLN
INTRAVENOUS | Status: DC | PRN
  Administered 2021-06-19: 30 mg via INTRAVENOUS
  Administered 2021-06-19: 50 mg via INTRAVENOUS

## 2021-06-19 MED ORDER — SUGAMMADEX SODIUM 200 MG/2ML IV SOLN
INTRAVENOUS | Status: DC | PRN
  Administered 2021-06-19: 250 mg via INTRAVENOUS

## 2021-06-19 MED ORDER — PROPOFOL 500 MG/50ML IV EMUL
INTRAVENOUS | Status: DC | PRN
  Administered 2021-06-19: 75 ug/kg/min via INTRAVENOUS

## 2021-06-19 MED ORDER — LIDOCAINE HCL (CARDIAC) PF 100 MG/5ML IV SOSY
PREFILLED_SYRINGE | INTRAVENOUS | Status: DC | PRN
  Administered 2021-06-19: 100 mg via INTRAVENOUS

## 2021-06-19 MED ORDER — TRANEXAMIC ACID-NACL 1000-0.7 MG/100ML-% IV SOLN
1000.0000 mg | INTRAVENOUS | Status: AC
  Administered 2021-06-19: 1000 mg via INTRAVENOUS

## 2021-06-19 MED ORDER — BUPIVACAINE LIPOSOME 1.3 % IJ SUSP
INTRAMUSCULAR | Status: AC
  Filled 2021-06-19: qty 20

## 2021-06-19 MED ORDER — ROCURONIUM BROMIDE 10 MG/ML (PF) SYRINGE
PREFILLED_SYRINGE | INTRAVENOUS | Status: AC
  Filled 2021-06-19: qty 10

## 2021-06-19 MED ORDER — ONDANSETRON HCL 4 MG/2ML IJ SOLN
INTRAMUSCULAR | Status: AC
  Filled 2021-06-19: qty 2

## 2021-06-19 MED ORDER — FENTANYL CITRATE (PF) 100 MCG/2ML IJ SOLN
25.0000 ug | INTRAMUSCULAR | Status: DC | PRN
  Administered 2021-06-19: 25 ug via INTRAVENOUS
  Administered 2021-06-19: 50 ug via INTRAVENOUS

## 2021-06-19 MED ORDER — TRAMADOL HCL 50 MG PO TABS
50.0000 mg | ORAL_TABLET | Freq: Four times a day (QID) | ORAL | Status: DC
  Administered 2021-06-19 – 2021-06-21 (×6): 50 mg via ORAL
  Filled 2021-06-19 (×6): qty 1

## 2021-06-19 MED ORDER — NEOMYCIN-POLYMYXIN B GU 40-200000 IR SOLN
Status: DC | PRN
  Administered 2021-06-19: 14 mL

## 2021-06-19 MED ORDER — ONDANSETRON HCL 4 MG PO TABS: 4.0000 mg | ORAL_TABLET | Freq: Four times a day (QID) | ORAL | Status: DC | PRN

## 2021-06-19 MED ORDER — GLYCOPYRROLATE 0.2 MG/ML IJ SOLN
INTRAMUSCULAR | Status: AC
  Filled 2021-06-19: qty 1

## 2021-06-19 MED ORDER — SUGAMMADEX SODIUM 500 MG/5ML IV SOLN
INTRAVENOUS | Status: AC
  Filled 2021-06-19: qty 5

## 2021-06-19 MED ORDER — 0.9 % SODIUM CHLORIDE (POUR BTL) OPTIME
TOPICAL | Status: DC | PRN
  Administered 2021-06-19: 500 mL

## 2021-06-19 MED ORDER — DEXMEDETOMIDINE (PRECEDEX) IN NS 20 MCG/5ML (4 MCG/ML) IV SYRINGE
PREFILLED_SYRINGE | INTRAVENOUS | Status: DC | PRN
  Administered 2021-06-19: 12 ug via INTRAVENOUS

## 2021-06-19 MED ORDER — DEXAMETHASONE SODIUM PHOSPHATE 10 MG/ML IJ SOLN
INTRAMUSCULAR | Status: AC
  Filled 2021-06-19: qty 1

## 2021-06-19 MED ORDER — NEOMYCIN-POLYMYXIN B GU 40-200000 IR SOLN
Status: AC
  Filled 2021-06-19: qty 20

## 2021-06-19 MED ORDER — CEFAZOLIN SODIUM-DEXTROSE 2-4 GM/100ML-% IV SOLN
INTRAVENOUS | Status: AC
  Filled 2021-06-19: qty 100

## 2021-06-19 MED ORDER — ACETAMINOPHEN 500 MG PO TABS
ORAL_TABLET | ORAL | Status: AC
  Filled 2021-06-19: qty 2

## 2021-06-19 MED ORDER — LACTATED RINGERS IV SOLN: INTRAVENOUS | Status: DC

## 2021-06-19 MED ORDER — ALPRAZOLAM 0.5 MG PO TABS
1.0000 mg | ORAL_TABLET | Freq: Every evening | ORAL | Status: DC | PRN
  Administered 2021-06-19 – 2021-06-20 (×2): 1 mg via ORAL
  Filled 2021-06-19 (×2): qty 2

## 2021-06-19 MED ORDER — CHLORHEXIDINE GLUCONATE 0.12 % MT SOLN
OROMUCOSAL | Status: AC
  Administered 2021-06-19: 15 mL via OROMUCOSAL
  Filled 2021-06-19: qty 15

## 2021-06-19 MED ORDER — HYDROMORPHONE HCL 1 MG/ML IJ SOLN
1.0000 mg | INTRAMUSCULAR | Status: DC | PRN
  Administered 2021-06-19 – 2021-06-21 (×11): 1 mg via INTRAVENOUS
  Filled 2021-06-19 (×12): qty 1

## 2021-06-19 MED ORDER — DIPHENHYDRAMINE HCL 12.5 MG/5ML PO ELIX: 12.5000 mg | ORAL_SOLUTION | ORAL | Status: DC | PRN

## 2021-06-19 MED ORDER — BISACODYL 5 MG PO TBEC: 10.0000 mg | DELAYED_RELEASE_TABLET | Freq: Every day | ORAL | Status: DC | PRN

## 2021-06-19 MED ORDER — SODIUM CHLORIDE 0.9 % IV SOLN: INTRAVENOUS | Status: DC

## 2021-06-19 MED ORDER — IRBESARTAN 150 MG PO TABS
300.0000 mg | ORAL_TABLET | Freq: Every day | ORAL | Status: DC
  Administered 2021-06-19 – 2021-06-21 (×3): 300 mg via ORAL
  Filled 2021-06-19 (×3): qty 2

## 2021-06-19 MED ORDER — ACETAMINOPHEN 500 MG PO TABS
ORAL_TABLET | ORAL | Status: AC
  Administered 2021-06-19: 1000 mg via ORAL
  Filled 2021-06-19: qty 2

## 2021-06-19 MED ORDER — DOCUSATE SODIUM 100 MG PO CAPS
100.0000 mg | ORAL_CAPSULE | Freq: Two times a day (BID) | ORAL | Status: DC
  Administered 2021-06-19 – 2021-06-21 (×5): 100 mg via ORAL
  Filled 2021-06-19 (×5): qty 1

## 2021-06-19 MED ORDER — BUPIVACAINE-EPINEPHRINE (PF) 0.25% -1:200000 IJ SOLN
INTRAMUSCULAR | Status: AC
  Filled 2021-06-19: qty 60

## 2021-06-19 MED ORDER — MIDAZOLAM HCL 5 MG/5ML IJ SOLN
INTRAMUSCULAR | Status: DC | PRN
  Administered 2021-06-19 (×2): 1 mg via INTRAVENOUS

## 2021-06-19 MED ORDER — CEFAZOLIN SODIUM-DEXTROSE 2-4 GM/100ML-% IV SOLN
2.0000 g | INTRAVENOUS | Status: AC
  Administered 2021-06-19: 2 g via INTRAVENOUS

## 2021-06-19 MED ORDER — MORPHINE SULFATE 4 MG/ML IJ SOLN
INTRAMUSCULAR | Status: DC | PRN
  Administered 2021-06-19: 62 mL via INTRAMUSCULAR

## 2021-06-19 MED ORDER — PHENYLEPHRINE HCL-NACL 20-0.9 MG/250ML-% IV SOLN
INTRAVENOUS | Status: DC | PRN
  Administered 2021-06-19: 20 ug/min via INTRAVENOUS

## 2021-06-19 MED ORDER — CHLORHEXIDINE GLUCONATE 0.12 % MT SOLN: 15.0000 mL | Freq: Once | OROMUCOSAL | Status: AC

## 2021-06-19 MED ORDER — HYDROMORPHONE HCL 1 MG/ML IJ SOLN: 0.5000 mg | INTRAMUSCULAR | Status: DC | PRN

## 2021-06-19 MED ORDER — CLINDAMYCIN PHOSPHATE 600 MG/50ML IV SOLN
INTRAVENOUS | Status: AC
  Filled 2021-06-19: qty 50

## 2021-06-19 MED ORDER — BUPIVACAINE HCL (PF) 0.5 % IJ SOLN
INTRAMUSCULAR | Status: AC
  Filled 2021-06-19: qty 10

## 2021-06-19 MED ORDER — SODIUM CHLORIDE FLUSH 0.9 % IV SOLN
INTRAVENOUS | Status: AC
  Filled 2021-06-19: qty 40

## 2021-06-19 MED ORDER — ENOXAPARIN SODIUM 40 MG/0.4ML IJ SOSY
40.0000 mg | PREFILLED_SYRINGE | INTRAMUSCULAR | Status: DC
  Administered 2021-06-20 – 2021-06-21 (×2): 40 mg via SUBCUTANEOUS
  Filled 2021-06-19 (×2): qty 0.4

## 2021-06-19 MED ORDER — KETOROLAC TROMETHAMINE 15 MG/ML IJ SOLN
15.0000 mg | Freq: Four times a day (QID) | INTRAMUSCULAR | Status: AC
  Administered 2021-06-19 – 2021-06-20 (×4): 15 mg via INTRAVENOUS
  Filled 2021-06-19 (×4): qty 1

## 2021-06-19 SURGICAL SUPPLY — 82 items
AGENT HMST KT MTR STRL THRMB (HEMOSTASIS) ×1
BLADE CLIPPER SURG (BLADE) ×1 IMPLANT
BLADE SAW 90X13X1.19 OSCILLAT (BLADE) ×2 IMPLANT
BLADE SAW 90X25X1.19 OSCILLAT (BLADE) ×2 IMPLANT
CEMENT HV SMART SET (Cement) ×4 IMPLANT
CEMENT TIBIA MBT SIZE 6 (Knees) IMPLANT
CNTNR SPEC 2.5X3XGRAD LEK (MISCELLANEOUS) ×1
CONT SPEC 4OZ STER OR WHT (MISCELLANEOUS) ×1
CONT SPEC 4OZ STRL OR WHT (MISCELLANEOUS) ×1
CONTAINER SPEC 2.5X3XGRAD LEK (MISCELLANEOUS) ×1 IMPLANT
COOLER POLAR GLACIER W/PUMP (MISCELLANEOUS) ×2 IMPLANT
COVER BACK TABLE REUSABLE LG (DRAPES) ×1 IMPLANT
COVER LIGHT HANDLE STERIS (MISCELLANEOUS) ×3 IMPLANT
CUFF TOURN SGL QUICK 24 (TOURNIQUET CUFF)
CUFF TOURN SGL QUICK 34 (TOURNIQUET CUFF)
CUFF TRNQT CYL 24X4X16.5-23 (TOURNIQUET CUFF) IMPLANT
CUFF TRNQT CYL 34X4.125X (TOURNIQUET CUFF) IMPLANT
DRAPE 3/4 80X56 (DRAPES) ×4 IMPLANT
DRAPE IMP U-DRAPE 54X76 (DRAPES) ×4 IMPLANT
DRAPE INCISE IOBAN 66X60 STRL (DRAPES) ×2 IMPLANT
DRAPE SURG 17X11 SM STRL (DRAPES) ×4 IMPLANT
DRAPE U-SHAPE 47X51 STRL (DRAPES) ×1 IMPLANT
DRSG AQUACEL AG ADV 3.5X10 (GAUZE/BANDAGES/DRESSINGS) ×2 IMPLANT
DRSG MEPILEX SACRM 8.7X9.8 (GAUZE/BANDAGES/DRESSINGS) ×2 IMPLANT
DURAPREP 26ML APPLICATOR (WOUND CARE) ×8 IMPLANT
ELECT REM PT RETURN 9FT ADLT (ELECTROSURGICAL) ×2
ELECTRODE REM PT RTRN 9FT ADLT (ELECTROSURGICAL) ×1 IMPLANT
FEMUR SIGMA PS SZ 6.0 L (Femur) ×1 IMPLANT
GAUZE 4X4 16PLY ~~LOC~~+RFID DBL (SPONGE) ×2 IMPLANT
GAUZE SPONGE 4X4 12PLY STRL (GAUZE/BANDAGES/DRESSINGS) ×2 IMPLANT
GLOVE SRG 8 PF TXTR STRL LF DI (GLOVE) ×2 IMPLANT
GLOVE SURG ENC TEXT LTX SZ7.5 (GLOVE) ×2 IMPLANT
GLOVE SURG ORTHO LTX SZ9 (GLOVE) ×4 IMPLANT
GLOVE SURG SYN 7.5  E (GLOVE) ×2
GLOVE SURG SYN 7.5 E (GLOVE) ×1 IMPLANT
GLOVE SURG SYN 7.5 PF PI (GLOVE) ×1 IMPLANT
GLOVE SURG UNDER POLY LF SZ8 (GLOVE) ×4
GLOVE SURG UNDER POLY LF SZ9 (GLOVE) ×2 IMPLANT
GOWN STRL REUS TWL 2XL XL LVL4 (GOWN DISPOSABLE) ×2 IMPLANT
GOWN STRL REUS W/ TWL LRG LVL3 (GOWN DISPOSABLE) ×1 IMPLANT
GOWN STRL REUS W/ TWL LRG LVL4 (GOWN DISPOSABLE) ×1 IMPLANT
GOWN STRL REUS W/ TWL XL LVL3 (GOWN DISPOSABLE) ×1 IMPLANT
GOWN STRL REUS W/TWL LRG LVL3 (GOWN DISPOSABLE) ×2
GOWN STRL REUS W/TWL LRG LVL4 (GOWN DISPOSABLE) ×2
GOWN STRL REUS W/TWL XL LVL3 (GOWN DISPOSABLE) ×2
HOLDER FOLEY CATH W/STRAP (MISCELLANEOUS) ×2 IMPLANT
IMMBOLIZER KNEE 19 BLUE UNIV (SOFTGOODS) ×2 IMPLANT
IV NS IRRIG 3000ML ARTHROMATIC (IV SOLUTION) ×2 IMPLANT
KIT TURNOVER KIT A (KITS) ×2 IMPLANT
MANIFOLD NEPTUNE II (INSTRUMENTS) ×4 IMPLANT
NDL SAFETY ECLIPSE 18X1.5 (NEEDLE) ×1 IMPLANT
NDL SPNL 20GX3.5 QUINCKE YW (NEEDLE) ×1 IMPLANT
NEEDLE HYPO 18GX1.5 SHARP (NEEDLE) ×2
NEEDLE HYPO 22GX1.5 SAFETY (NEEDLE) ×2 IMPLANT
NEEDLE SPNL 20GX3.5 QUINCKE YW (NEEDLE) ×2 IMPLANT
NS IRRIG 1000ML POUR BTL (IV SOLUTION) ×2 IMPLANT
PACK TOTAL KNEE (MISCELLANEOUS) ×2 IMPLANT
PAD WRAPON POLAR KNEE (MISCELLANEOUS) ×1 IMPLANT
PATELLA DOME PFC 41MM (Knees) ×1 IMPLANT
PENCIL SMOKE EVACUATOR COATED (MISCELLANEOUS) ×2 IMPLANT
PIN DRILL FIX HALF THREAD (BIT) ×4 IMPLANT
PLATE ROT INSERT 10MM SIZE 6 (Plate) ×1 IMPLANT
PULSAVAC PLUS IRRIG FAN TIP (DISPOSABLE) ×2
SPONGE INTESTINAL PEANUT (DISPOSABLE) ×1 IMPLANT
SPONGE T-LAP 18X18 ~~LOC~~+RFID (SPONGE) ×8 IMPLANT
STAPLER SKIN PROX 35W (STAPLE) ×2 IMPLANT
SUCTION FRAZIER HANDLE 10FR (MISCELLANEOUS) ×2
SUCTION TUBE FRAZIER 10FR DISP (MISCELLANEOUS) ×1 IMPLANT
SURGIFLO W/THROMBIN 8M KIT (HEMOSTASIS) ×1 IMPLANT
SUT BONE WAX W31G (SUTURE) ×1 IMPLANT
SUT ETHIBOND NAB CT1 #1 30IN (SUTURE) ×5 IMPLANT
SUT VIC AB 0 CT1 36 (SUTURE) ×2 IMPLANT
SUT VIC AB 2-0 CT1 (SUTURE) ×5 IMPLANT
SYR 20ML LL LF (SYRINGE) ×2 IMPLANT
SYR 30ML LL (SYRINGE) ×4 IMPLANT
TIBIA MBT CEMENT SIZE 6 (Knees) ×2 IMPLANT
TIP FAN IRRIG PULSAVAC PLUS (DISPOSABLE) ×1 IMPLANT
TOWER CARTRIDGE SMART MIX (DISPOSABLE) ×2 IMPLANT
TRAY FOLEY MTR SLVR 16FR STAT (SET/KITS/TRAYS/PACK) ×2 IMPLANT
TUBE SUCT KAM VAC (TUBING) ×2 IMPLANT
WATER STERILE IRR 500ML POUR (IV SOLUTION) ×2 IMPLANT
WRAPON POLAR PAD KNEE (MISCELLANEOUS) ×2

## 2021-06-19 NOTE — Plan of Care (Signed)
Pt from PACU in stable conditon. Alert and oriented x4. Not complaining of pain. Requesting foley to be removed. Ambulated with PT around nursing station. Back in bed talking to visitor. No acute distress noted at this time

## 2021-06-19 NOTE — Transfer of Care (Signed)
Immediate Anesthesia Transfer of Care Note  Patient: Zachary Chung  Procedure(s) Performed: LEFT TOTAL KNEE ARTHROPLASTY (Left: Knee) STITCH AND REPAIR OF ARTERY (Left: Knee)  Patient Location: PACU  Anesthesia Type:General  Level of Consciousness: awake, alert  and oriented  Airway & Oxygen Therapy: Patient Spontanous Breathing and Patient connected to face mask oxygen  Post-op Assessment: Report given to RN and Post -op Vital signs reviewed and stable  Post vital signs: Reviewed and stable  Last Vitals:  Vitals Value Taken Time  BP 148/104 06/19/21 1221  Temp 36.2 C 06/19/21 1221  Pulse 80 06/19/21 1226  Resp 13 06/19/21 1226  SpO2 100 % 06/19/21 1226    Last Pain:  Vitals:   06/19/21 1221  TempSrc:   PainSc: 8          Complications: No notable events documented.

## 2021-06-19 NOTE — Progress Notes (Signed)
  Subjective:  POST OP CHECK s/p Left TKA.   Patient reports left knee pain as marked.  Pain has improved since immediately postop in the PACU.  Patient still having significant pain however, but this is responding to Dilaudid and oxycodone as ordered.  Patient denies any chest pain or shortness of breath.  He had mild nausea earlier.  Patient states he was able to walk around the nurses station with physical therapy earlier this afternoon.  Patient's wife is at the bedside.  His nurse is in the room.  Objective:   VITALS:   Vitals:   06/19/21 1415 06/19/21 1430 06/19/21 1454 06/19/21 2005  BP: (!) 139/108 (!) 145/101 (!) 151/102 (!) 138/96  Pulse: 63 64 68 86  Resp: 18 13 18 20   Temp:   98.4 F (36.9 C) 97.9 F (36.6 C)  TempSrc:      SpO2: 99% 98% 96% 95%    PHYSICAL EXAM: Left lower extremity Neurovascular intact Sensation intact distally Intact pulses distally Dorsiflexion/Plantar flexion intact Incision: dressing C/D/I No cellulitis present Compartment soft  LABS  Results for orders placed or performed during the hospital encounter of 06/19/21 (from the past 24 hour(s))  ABO/Rh     Status: None   Collection Time: 06/19/21  7:07 AM  Result Value Ref Range   ABO/RH(D)      Jenetta Downer NEG Performed at [facility], [address] [place], [place] [address]     DG Knee Left Port  Result Date: 06/19/2021 CLINICAL DATA:  Status post left knee arthroplasty. EXAM: PORTABLE LEFT KNEE - 1-2 VIEW COMPARISON:  None. FINDINGS: The left femoral and tibial components are well situated. Expected postoperative changes are noted in the soft tissues anteriorly. IMPRESSION: Status post left total knee arthroplasty. Electronically Signed   By: Marijo Conception M.D.   On: 06/19/2021 13:12    Assessment/Plan: Day of Surgery   Principal Problem:   S/P TKR (total knee replacement) using cement, left  Patient stable postop.  Continue current pain management.  Continue with physical  therapy.  I have reviewed the postop x-rays which demonstrate the total knee arthroplasty components are well-positioned.  There is no evidence of postop complication such as fracture, dislocation or loosening.  Patient will complete 24 hours of postop antibiotics.  Patient will begin Lovenox tomorrow.  Foley catheter will be removed in the a.m.  Patient will have a.m. labs drawn.   Thornton Park , MD 06/19/2021, 8:41 PM

## 2021-06-19 NOTE — Evaluation (Signed)
Physical Therapy Evaluation Patient Details Name: Zachary Chung MRN: [id number] DOB: 1961/05/12 Today's Date: 06/19/2021  History of Present Illness  60 y.o. male s/p L TKA on 06/19/21.  Clinical Impression  Pt received supine in bed, supportive wife at bedside, agreeable to therapy. Pt lives in a multilevel home with bed/bath on 2nd floor. Pt able to complete LLE SLR. Pt performed bed mobility with SUP, STS and ambulation with CGA using RW. Pain did increase from 7/10 to 9/10 with mobility - will attempt a full flight of stairs and review therex packet in treatment sessions tomorrow. PT encouraged knee extension by propping left heel up with folded blanket; pt asked to hold on using the bone foam until tomorrow. Pt returned to bed, cryo reapplied, visitors in room. All needs met. Rec HHPT upon d/c. Would benefit from skilled PT to address above deficits and promote optimal return to PLOF.       Recommendations for follow up therapy are one component of a multi-disciplinary discharge planning process, led by the attending physician.  Recommendations may be updated based on patient status, additional functional criteria and insurance authorization.  Follow Up Recommendations Home health PT    Assistance Recommended at Discharge Intermittent Supervision/Assistance  Functional Status Assessment Patient has had a recent decline in their functional status and demonstrates the ability to make significant improvements in function in a reasonable and predictable amount of time.  Equipment Recommendations  None recommended by PT    Recommendations for Other Services       Precautions / Restrictions Precautions Precautions: Fall Restrictions Weight Bearing Restrictions: Yes LLE Weight Bearing: Weight bearing as tolerated Other Position/Activity Restrictions: L TKA      Mobility  Bed Mobility Overal bed mobility: Needs Assistance Bed Mobility: Supine to Sit;Sit to Supine     Supine to  sit: Supervision;HOB elevated Sit to supine: Supervision;HOB elevated   General bed mobility comments: Increased time and effort to mobilize LLE; assist with managing lines.    Transfers Overall transfer level: Needs assistance Equipment used: Rolling walker (2 wheels) Transfers: Sit to/from Stand Sit to Stand: Min guard;From elevated surface           General transfer comment: CGA for safety. STS x2 reps from EOB and toilet (BSC).    Ambulation/Gait Ambulation/Gait assistance: Min guard Gait Distance (Feet): 200 Feet Assistive device: Rolling walker (2 wheels) Gait Pattern/deviations: Step-through pattern;Decreased stance time - left;Decreased weight shift to left Gait velocity: decreased     General Gait Details: Pt ambulated 220ft through halls; increased UE support with WB through LLE due to post-surgical pain. VC on energy conservation - to push RW rather than lift.  Stairs            Wheelchair Mobility    Modified Rankin (Stroke Patients Only)       Balance Overall balance assessment: Needs assistance Sitting-balance support: Feet supported Sitting balance-Leahy Scale: Normal       Standing balance-Leahy Scale: Fair Standing balance comment: does not rely on RW during static standing; does rely on RW during ambulation                             Pertinent Vitals/Pain Pain Assessment: 0-10 Pain Score: 7  Pain Location: anterior knee, left -- elevated to 9/10 after mobility Pain Intervention(s): Limited activity within patient's tolerance;Monitored during session;Premedicated before session;Repositioned    Home Living Family/patient expects to be discharged to:: Private residence  Living Arrangements: Spouse/significant other Available Help at Discharge: Family;Available 24 hours/day Type of Home: House Home Access: Stairs to enter Entrance Stairs-Rails: Right;Left Entrance Stairs-Number of Steps: 5 Alternate Level Stairs-Number of  Steps: 13+3 Home Layout: Multi-level;Bed/bath upstairs Home Equipment: Conservation officer, nature (2 wheels);Toilet riser Additional Comments: borrowing RW and elevated toilet seat with pushup bars    Prior Function Prior Level of Function : Independent/Modified Independent;Working/employed;Driving             Mobility Comments: Independent with all mobility, does not use AD. 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 ADLs Comments: Independent     Hand Dominance        Extremity/Trunk Assessment   Upper Extremity Assessment Upper Extremity Assessment: Overall WFL for tasks assessed    Lower Extremity Assessment Lower Extremity Assessment: LLE deficits/detail LLE Deficits / Details: s/p L TKA - able to perform SLR. RLE WFL.       Communication   Communication: No difficulties  Cognition Arousal/Alertness: Awake/alert Behavior During Therapy: WFL for tasks assessed/performed Overall Cognitive Status: Within Functional Limits for tasks assessed                                          General Comments      Exercises Other Exercises Other Exercises: Supine<>sit, STS x2, ambulation 222ft, ambulatory toilet transfer, standing w/ hand hygiene. Pt education on POC, d/c recs, knee extension (folded blanket placed under L ankle).   Assessment/Plan    PT Assessment Patient needs continued PT services  PT Problem List Decreased strength;Decreased mobility;Decreased range of motion;Decreased activity tolerance;Decreased balance       PT Treatment Interventions Therapeutic activities;DME instruction;Gait training;Stair training;Balance training;Functional mobility training;Therapeutic exercise;Patient/family education    PT Goals (Current goals can be found in the Care Plan section)  Acute Rehab PT Goals Patient Stated Goal: to go home with wife PT Goal Formulation: With patient Time For Goal Achievement: 07/03/21 Potential to Achieve Goals: Good    Frequency BID    Barriers to discharge        Co-evaluation               AM-PAC PT "6 Clicks" Mobility  Outcome Measure Help needed turning from your back to your side while in a flat bed without using bedrails?: None Help needed moving from lying on your back to sitting on the side of a flat bed without using bedrails?: A Little Help needed moving to and from a bed to a chair (including a wheelchair)?: A Little Help needed standing up from a chair using your arms (e.g., wheelchair or bedside chair)?: A Little Help needed to walk in hospital room?: A Little Help needed climbing 3-5 steps with a railing? : A Little 6 Click Score: 19    End of Session Equipment Utilized During Treatment: Gait belt Activity Tolerance: Patient tolerated treatment well Patient left: in bed;with call bell/phone within reach;with family/visitor present Nurse Communication: Mobility status;Precautions PT Visit Diagnosis: Difficulty in walking, not elsewhere classified (R26.2);Muscle weakness (generalized) (M62.81)    Time: 4580-9983 PT Time Calculation (min) (ACUTE ONLY): 42 min   Charges:   PT Evaluation $PT Eval Low Complexity: 1 Low PT Treatments $Gait Training: 8-22 mins $Therapeutic Activity: 8-22 mins        Patrina Levering PT, DPT 06/19/21 5:14 PM [phone]

## 2021-06-19 NOTE — Anesthesia Procedure Notes (Signed)
Procedure Name: Intubation Date/Time: 06/19/2021 10:10 AM Performed by: Johnna Acosta, CRNA Pre-anesthesia Checklist: Emergency Drugs available, Suction available and Patient being monitored Patient Re-evaluated:Patient Re-evaluated prior to induction Oxygen Delivery Method: Circle system utilized Preoxygenation: Pre-oxygenation with 100% oxygen Induction Type: IV induction Ventilation: Mask ventilation with difficulty and Oral airway inserted - appropriate to patient size Laryngoscope Size: McGraph and 4 Grade View: Grade II Tube type: Oral Tube size: 7.5 mm Number of attempts: 1 Airway Equipment and Method: Stylet and Video-laryngoscopy Placement Confirmation: ETT inserted through vocal cords under direct vision, positive ETCO2 and breath sounds checked- equal and bilateral Secured at: 24 cm Tube secured with: Tape Dental Injury: Teeth and Oropharynx as per pre-operative assessment  Difficulty Due To: Difficulty was anticipated, Difficult Airway- due to anterior larynx and Difficult Airway- due to limited oral opening

## 2021-06-19 NOTE — OR Nursing (Signed)
Tourniquet pressure increased from 275 to 300 at 1055. TQ deflated at 1102.

## 2021-06-19 NOTE — Op Note (Signed)
Called into the procedure for bleeding issues with left total knee replacement.  On joining the field, Dr. Mack Guise and his team had done a great job of reasonably controlling the arterial bleeding with pressure.  This appeared to be a geniculate collateral laterally.  We were able to identify the area of bleeding and I placed a pair of figure-of-eight 5-0 Prolene sutures to control the arterial bleeding in this location.  Following suture ligation, no further active bleeding was seen.  The wound was irrigated and Surgiflo was placed and then I turned the operating table back over to Dr. Mack Guise and his team for closure.

## 2021-06-19 NOTE — Anesthesia Procedure Notes (Signed)
Spinal  Patient location during procedure: OR Start time: 06/19/2021 7:55 AM End time: 06/19/2021 8:01 AM Reason for block: surgical anesthesia Staffing Performed: anesthesiologist and resident/CRNA  Anesthesiologist: Piscitello, Precious Haws, MD Resident/CRNA: Johnna Acosta, CRNA Preanesthetic Checklist Completed: patient identified, IV checked, site marked, risks and benefits discussed, surgical consent, monitors and equipment checked, pre-op evaluation and timeout performed Spinal Block Patient position: sitting Prep: ChloraPrep Patient monitoring: heart rate, continuous pulse ox, blood pressure and cardiac monitor Approach: midline Location: L3-4 Injection technique: single-shot Needle Needle type: Whitacre and Introducer  Needle gauge: 24 G Needle length: 9 cm Assessment Sensory level: T10 Events: CSF return Additional Notes Negative paresthesia. Negative blood return. Positive free-flowing CSF. Expiration date of kit checked and confirmed. Patient tolerated procedure well, without complications.

## 2021-06-19 NOTE — Op Note (Addendum)
DATE OF SURGERY:  06/19/2021 TIME: 12:21 PM  PATIENT NAME:  Zachary Chung   AGE: 60 y.o.    PRE-OPERATIVE DIAGNOSIS:  Left Knee Osteoarthritis  Chung-OPERATIVE DIAGNOSIS:  Same  PROCEDURE:  Procedure(s): LEFT TOTAL KNEE ARTHROPLASTY STITCH AND REPAIR OF ARTERY  SURGEON:  Thornton Park, MD   ASSISTANT:  Roland Rack, PA     Leotis Pain, MD  Vascular Surgery  OPERATIVE IMPLANTS: Depuy PFC Sigma, Posterior Stabilized Femural component size 6, Tibia size rotating platform component size 6, Patella polyethylene 3-peg oval button size 41, with a 10 mm polyethylene insert.  Surgiflo applied in wound for lateral bleeding vessel.    EBL:  200 cc   TOURNIQUET TIME:  138 minutes  PREOPERATIVE INDICATIONS:  Zachary Chung is an 60 y.o. male who has a diagnosis of  Left Knee Osteoarthritis and elected for a left total knee arthroplasty after failing nonoperative treatment.  Their knee pain significantly impacts their activity of daily living.  Radiographs have demonstrated tricompartmental osteoarthritis joint space narrowing, osteophytes, and subchondral sclerosis.  The risks, benefits, and alternatives were discussed at length including but not limited to the risks of infection, bleeding, nerve or blood vessel injury, knee stiffness, fracture, dislocation, loosening or failure of the hardware and the need for further surgery. Medical risks include but not limited to DVT and pulmonary embolism, myocardial infarction, stroke, pneumonia, respiratory failure and death. I discussed these risks with the patient in my office prior to the date of surgery. They understood these risks and were willing to proceed.  OPERATIVE FINDINGS AND UNIQUE ASPECTS OF THE CASE: Advanced tricompartmental osteoarthritis.  OPERATIVE DESCRIPTION:  The patient was brought to the operative room and placed in a supine position after undergoing placement of a spinal anesthetic which was converted during the case to  general anesthesia with endotracheal intubation due to patient experiencing pain despite the block..  A Foley catheter was placed.  IV antibiotics were given. Patient received 2 g of Ancef IV and clindamycin 600 mg IV.  A dose of tranexamic acid was also given prior to inflation of the tourniquet.  The lower extremity was prepped and draped in the usual sterile fashion.  A time out was performed to verify the patient's name, date of birth, medical record number, correct site of surgery and correct procedure to be performed. The timeout was also used to confirm the patient received antibiotics and that appropriate instruments, implants and radiographs studies were available in the room.  The leg was elevated and exsanguinated with an Esmarch and the tourniquet was inflated to 275 mmHg for approximately 125 minutes when it was let down.  Tourniquet was reinflated at 300 mmHg for approximately 13 minutes to treat a bleeding vessel.  A midline incision was made over the left knee. Full-thickness skin flaps were developed. A medial parapatellar arthrotomy was then made and the patella everted and the knee was brought into 90 of flexion. Hoffa's fat pad along with the cruciate ligaments and medial and lateral menisci were resected.   The distal femoral intramedullary canal was opened with a drill and the intramedullary distal femoral cutting jig was inserted into the femoral canal pinned into position. It was set at 5 degrees resecting 10 mm off the distal femur.  Care was taken to protect the collateral ligaments during distal femoral resection.  The distal femoral resection was performed with an oscillating saw. The femoral cutting guide was then removed.  The extramedullary tibial cutting guide was then  placed using the anterior tibial crest and second ray of the foot as a references.  The tibial cutting guide was adjusted to allow for appropriate posterior slope.  The tibial cutting block was pinned into  position. The slotted stylus was used to measure the proximal tibial resection of 8 mm off the high lateral side.  The tibial long rod alignment guide was then used to confirm position of the cutting block. A third cross pin through the tibial cutting block was then drilled into position to allow for rotational stability. Care was taken during the tibial resection to protect the medial and collateral ligaments.  The resected tibial bone was removed along with the posterior horns of the menisci.  The PCL was sacrificed.  Extension gap was measured with a spacer block and alignment and extension was confirmed using a long alignment rod.  The attention was then turned back to the femur. The posterior referencing distal femoral sizing guide was applied to the distal femur.  The femur was sized to be a size 6. Rotation of the referencing guide was checked with the epicondylar axis and Whitesides line. Then the 4-in-1 cutting jig was then applied to the distal femur. A stylus was used to confirm that the anterior femur would not be notched.   Then the anterior, posterior and chamfer femoral cuts were then made with an oscillating saw.  The flexion gap was then measured with a flexion spacer block and long alignment rod and was found to be symmetric with the extension gap and perpendicular to mechanical axis of the tibia.  The distal femoral preparation was completed by performing the posterior stabilized box cut using the cutting block. The entry site for the intramedullary femoral guide was filled with autologous bone graft from bone previously resected earlier in the case.  The proximal tibia plateau was then sized with trial trays. The best coverage was achieved with a size 6. This tibial tray was then pinned into position. The proximal tibia was then prepared with the reamer and keel punch.  After tibial preparation was completed, all trial components were inserted with polyethylene trials.  The knee was found  to have excellent balance and full motion with a size 10 mm tibial polyethylene insert..    The attention was then turned to preparation of the patella. The thickness of the patella was measured with a caliper, the diameter measured with the patella templates.  The patella resection was then made with an oscillating saw using the patella cutting guide.  The diameter of the patella component was 41 mm.  3 peg holes for the patella component were then drilled. The trial patella was then placed. Knee was taken through a full range of motion and deemed to be stable with the trial components. All trial components were then removed. The knee capsule was then injected with Exparel.  The knee joint capsule was injected with a mixture of quarter percent Marcaine, Toradol and morphine to assist with postoperative pain relief.  The joint was copiously irrigated with pulse lavage.  The final total knee arthroplasty components were then cemented into place with a 10 mm trial polyethylene insert and all excess methylmethacrylate was removed.  The joint was again copiously irrigated. After the cement had hardened the knee was again taken through a full range of motion. It was felt to be most stable with the 10 mm tibial polyethylene insert.  The knee was copiously irrigated with pulse lavage.  The tourniquet was then let  down 125 minutes.  Patient was found to have a bleeding vessel in the posterior lateral knee which did not initially respond to electrocautery coagulation.  Dr. Lucky Cowboy from vascular surgery was consulted and he placed two figure of 8 sutures to ligate the lateral bleeding vessel which was felt to be a branch of the geniculate artery.  The knee was then copiously irrigated and Surgiflo was placed at the site of the bleed.  The actual tibial polyethylene insert was then placed.   The knee was taken through a range of motion and the patella tracked well.  A partial release of the anterior portion of the IT band  was performed.  The knee was again copiously gated.    The medial arthrotomy was closed with #1 Ethibond. The subcutaneous tissue closed with 0 and 2-0 vicryl, and skin approximated with staples.  A dry sterile and compressive dressing was applied.  A Polar Care was applied to the operative knee along with a knee immobilizer.  The patient was then awakened and brought to the PACU in stable condition.  All sharp, lap and instrument counts were correct at the conclusion the case. I spoke with the patient's wife in the postop consultation room to let her know the case had been performed without complication and the patient was stable in recovery room.

## 2021-06-19 NOTE — H&P (Signed)
PREOPERATIVE H&P  Chief Complaint: Left Knee Osteoarthritis  HPI: Zachary Chung is a 60 y.o. male who presents for preoperative history and physical with a diagnosis of Left Knee Osteoarthritis. Symptoms of pain and swelling are significantly impairing activities of daily living.  He has failed nonoperative management including physical therapy, corticosteroid injection, and NSAIDs.  Patient's x-rays demonstrate advanced tricompartmental osteoarthritis with lateral greater than medial joint space narrowing and significant patellofemoral compartment arthrosis.  Patient has previous ACL hardware in place on his x-rays.  Patient wished to proceed with a left total knee arthroplasty.  Past Medical History:  Diagnosis Date   Adenomatous polyp of ascending colon    Anxiety    Arthritis    Depression    H/O NO MEDS   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)    left calf   ED (erectile dysfunction)    Elevated PSA    GERD (gastroesophageal reflux disease)    Hypertension    Pneumonia 1994   Sleep apnea    wears BI-PAP   Wears glasses    Past Surgical History:  Procedure Laterality Date   BIOPSY  01/26/2019   Procedure: BIOPSY;  Surgeon: Laurence Spates, MD;  Location: [facility];  Service: Endoscopy;;   COLONOSCOPY     COLONOSCOPY WITH PROPOFOL N/A 05/04/2017   Procedure: COLONOSCOPY WITH PROPOFOL;  Surgeon: Laurence Spates, MD;  Location: [facility];  Service: Endoscopy;  Laterality: N/A;   COLONOSCOPY WITH PROPOFOL N/A 01/26/2019   Procedure: COLONOSCOPY WITH PROPOFOL;  Surgeon: Laurence Spates, MD;  Location: [facility];  Service: Endoscopy;  Laterality: N/A;   HOT HEMOSTASIS N/A 05/04/2017   Procedure: HOT HEMOSTASIS (ARGON PLASMA COAGULATION/BICAP);  Surgeon: Laurence Spates, MD;  Location: [facility];  Service: Endoscopy;  Laterality: N/A;   KNEE ARTHROSCOPY W/ ACL RECONSTRUCTION     left   KNEE ARTHROSCOPY WITH MENISCAL REPAIR Left 12/12/2020   Procedure: KNEE ARTHROSCOPY  WITH MENISCAL REPAIR;  Surgeon: Thornton Park, MD;  Location: [facility];  Service: Orthopedics;  Laterality: Left;   POLYPECTOMY  01/26/2019   Procedure: POLYPECTOMY;  Surgeon: Laurence Spates, MD;  Location: [facility];  Service: Endoscopy;;   SEPTOPLASTY     SINUS SURGERY WITH INSTATRAK  05/20/2017   @ [place]   TRANSANAL EXCISION OF RECTAL MASS N/A 06/03/2017   Procedure: TRANSANAL EXCISION OF RECTAL POLYP;  Surgeon: [name], MD;  Location: [facility];  Service: General;  Laterality: N/A;   WISDOM TOOTH EXTRACTION     Social History   Socioeconomic History   Marital status: Married    Spouse name: Not on file   Number of children: Not on file   Years of education: Not on file   Highest education level: Not on file  Occupational History   Not on file  Tobacco Use   Smoking status: Some Days    Types: Cigars   Smokeless tobacco: Never   Tobacco comments:    VERY SELDOM ONLY WHEN HE PLAYS GOLF   Vaping Use   Vaping Use: Never used  Substance and Sexual Activity   Alcohol use: Yes    Alcohol/week: 21.0 standard drinks    Types: 21 Cans of beer per week   Drug use: No   Sexual activity: Yes    Birth control/protection: None  Other Topics Concern   Not on file  Social History Narrative   Not on file   Social Determinants of Health   Financial Resource Strain: Not  on file  Food Insecurity: Not on file  Transportation Needs: Not on file  Physical Activity: Not on file  Stress: Not on file  Social Connections: Not on file   Family History  Problem Relation Age of Onset   Parkinson's disease Mother    Colon polyps Mother    Heart disease Father    Colon cancer Maternal Uncle 40   Allergies  Allergen Reactions   Tetracyclines & Related Rash   Prior to Admission medications   Medication Sig Start Date End Date Taking? Authorizing Provider  ALPRAZolam Duanne Moron) 1 MG tablet Take 1 tablet by mouth twice a day Patient taking differently: Take  1 mg by mouth at bedtime as needed for sleep. 02/07/21  Yes   amLODipine (NORVASC) 5 MG tablet take 1 tablet (5 mg) by oral route once daily Patient taking differently: Take 5 mg by mouth at bedtime. 12/02/20  Yes   ondansetron (ZOFRAN-ODT) 8 MG disintegrating tablet take 1 tablet (8 mg) and place on top of the tongue where it will dissolve, then swallow by oral route every 8 hours 02/07/21  Yes   Oxycodone HCl 10 MG TABS 1 po q 6-8 hours prn pain Patient taking differently: Take 10 mg by mouth every 6 (six) hours as needed (pain). 05/26/21  Yes   Oxycodone HCl 10 MG TABS Take 1 tablet (10 mg total) by mouth every 6 to 8 hours as needed for pain. 06/03/21  Yes   Oxycodone HCl 10 MG TABS 1 po q 6-8 hours prn pain 06/11/21  Yes   tadalafil (CIALIS) 20 MG tablet TAKE 1 TABLET BY MOUTH DAILY AS NEEDED FOR ED. TAKE 30-60 MINUTES PRIOR TO INTERCOURSE. 02/10/21  Yes Stoioff, Ronda Fairly, MD  telmisartan (MICARDIS) 80 MG tablet TAKE 1 TABLET BY MOUTH ONCE DAILY Patient taking differently: Take 80 mg by mouth at bedtime. 09/27/20 09/27/21 Yes Albina Billet, MD     Positive ROS: All other systems have been reviewed and were otherwise negative with the exception of those mentioned in the HPI and as above.  Physical Exam: General: Alert, no acute distress Cardiovascular: Regular rate and rhythm, no murmurs rubs or gallops.  No pedal edema Respiratory: Clear to auscultation bilaterally, no wheezes rales or rhonchi. No cyanosis, no use of accessory musculature GI: No organomegaly, abdomen is soft and non-tender nondistended with positive bowel sounds. Skin: Skin intact, no lesions within the operative field. Neurologic: Sensation intact distally Psychiatric: Patient is competent for consent with normal mood and affect Lymphatic: No cervical lymphadenopathy  MUSCULOSKELETAL: Left knee: Patient skin is intact.  He has a small effusion.  There is no erythema or ecchymosis.  He can achieve near full extension and can  flex to approximately 120 degrees.  Patient has no gross ligamentous laxity.  He has tenderness over the medial and lateral joint lines.  He has a negative McMurray's test.  He has no calf tenderness or lower leg edema.  His palpable pedal pulses, intact station light touch and intact motor function distally.  Assessment: Left Knee Osteoarthritis  Plan: Plan for Procedure(s): LEFT TOTAL KNEE ARTHROPLASTY  I met the patient in the preoperative area.  I signed the left knee according to hospital's correct site of surgery protocol.  A preop history and physical was performed at the bedside.  I reviewed the details of the operation as well as the postoperative course with the patient and answered all his questions.  I discussed with the patient the risks and  benefits of surgery previously in my office. The risks include but are not limited to infection requiring the removal of the prosthesis, bleeding requiring blood transfusion, nerve or blood vessel injury, joint stiffness or loss of motion, persistent pain, weakness or instability, loosening or failure of the prosthesis and the need for further surgery. Medical risks include but are not limited to DVT and pulmonary embolism, myocardial infarction, stroke, pneumonia, respiratory failure and death. Patient understood these risks and wished to proceed.     Thornton Park, MD   06/19/2021 7:47 AM

## 2021-06-19 NOTE — Anesthesia Procedure Notes (Signed)
Date/Time: 06/19/2021 8:04 AM Performed by: Johnna Acosta, CRNA Pre-anesthesia Checklist: Patient identified, Emergency Drugs available, Suction available, Patient being monitored and Timeout performed Patient Re-evaluated:Patient Re-evaluated prior to induction Oxygen Delivery Method: Simple face mask Preoxygenation: Pre-oxygenation with 100% oxygen Induction Type: IV induction

## 2021-06-19 NOTE — Anesthesia Preprocedure Evaluation (Signed)
Anesthesia Evaluation  Patient identified by MRN, date of birth, ID band Patient awake    Reviewed: Allergy & Precautions, NPO status , Patient's Chart, lab work & pertinent test results  History of Anesthesia Complications Negative for: history of anesthetic complications  Airway Mallampati: III  TM Distance: >3 FB Neck ROM: full    Dental  (+) Chipped, Poor Dentition   Pulmonary neg shortness of breath, sleep apnea , Current Smoker,    Pulmonary exam normal        Cardiovascular Exercise Tolerance: Good hypertension, (-) angina(-) DOE Normal cardiovascular exam     Neuro/Psych PSYCHIATRIC DISORDERS negative neurological ROS     GI/Hepatic Neg liver ROS, GERD  Medicated and Controlled,  Endo/Other  negative endocrine ROS  Renal/GU      Musculoskeletal  (+) Arthritis ,   Abdominal   Peds  Hematology negative hematology ROS (+)   Anesthesia Other Findings Past Medical History: No date: Adenomatous polyp of ascending colon No date: Anxiety No date: Arthritis No date: Depression     Comment:  H/O NO MEDS No date: 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)     Comment:  left calf No date: ED (erectile dysfunction) No date: Elevated PSA No date: GERD (gastroesophageal reflux disease) No date: Hypertension 1994: Pneumonia No date: Sleep apnea     Comment:  wears BI-PAP No date: Wears glasses  Past Surgical History: 01/26/2019: BIOPSY     Comment:  Procedure: BIOPSY;  Surgeon: Laurence Spates, MD;                Location: [facility];  Service: Endoscopy;; No date: COLONOSCOPY 05/04/2017: COLONOSCOPY WITH PROPOFOL; N/A     Comment:  Procedure: COLONOSCOPY WITH PROPOFOL;  Surgeon: Laurence Spates, MD;  Location: [facility];  Service: Endoscopy;               Laterality: N/A; 01/26/2019: COLONOSCOPY WITH PROPOFOL; N/A     Comment:  Procedure: COLONOSCOPY WITH PROPOFOL;  Surgeon: Laurence Spates, MD;  Location: [facility];  Service: Endoscopy;               Laterality: N/A; 05/04/2017: HOT HEMOSTASIS; N/A     Comment:  Procedure: HOT HEMOSTASIS (ARGON PLASMA               COAGULATION/BICAP);  Surgeon: Laurence Spates, MD;                Location: [facility];  Service: Endoscopy;  Laterality:              N/A; No date: KNEE ARTHROSCOPY W/ ACL RECONSTRUCTION     Comment:  left 12/12/2020: KNEE ARTHROSCOPY WITH MENISCAL REPAIR; Left     Comment:  Procedure: KNEE ARTHROSCOPY WITH MENISCAL REPAIR;                Surgeon: Thornton Park, MD;  Location: [facility];                Service: Orthopedics;  Laterality: Left; 01/26/2019: POLYPECTOMY     Comment:  Procedure: POLYPECTOMY;  Surgeon: Laurence Spates, MD;                Location: [facility];  Service: Endoscopy;; No date: SEPTOPLASTY 05/20/2017: SINUS SURGERY WITH INSTATRAK     Comment:  @ [place] 06/03/2017: TRANSANAL  EXCISION OF RECTAL MASS; N/A     Comment:  Procedure: TRANSANAL EXCISION OF RECTAL POLYP;  Surgeon:              [name], MD;  Location: [facility];  Service:               General;  Laterality: N/A; No date: WISDOM TOOTH EXTRACTION     Reproductive/Obstetrics negative OB ROS                             Anesthesia Physical Anesthesia Plan  ASA: 3  Anesthesia Plan: Spinal   Post-op Pain Management:    Induction:   PONV Risk Score and Plan:   Airway Management Planned: Natural Airway and Nasal Cannula  Additional Equipment:   Intra-op Plan:   Post-operative Plan:   Informed Consent: I have reviewed the patients History and Physical, chart, labs and discussed the procedure including the risks, benefits and alternatives for the proposed anesthesia with the patient or authorized representative who has indicated his/her understanding and acceptance.     Dental Advisory Given  Plan Discussed with: Anesthesiologist, CRNA and  Surgeon  Anesthesia Plan Comments: (Patient reports no bleeding problems and no anticoagulant use.  Plan for spinal with backup GA  Patient consented for risks of anesthesia including but not limited to:  - adverse reactions to medications - damage to eyes, teeth, lips or other oral mucosa - nerve damage due to positioning  - risk of bleeding, infection and or nerve damage from spinal that could lead to paralysis - risk of headache or failed spinal - damage to teeth, lips or other oral mucosa - sore throat or hoarseness - damage to heart, brain, nerves, lungs, other parts of body or loss of life  Patient voiced understanding.)        Anesthesia Quick Evaluation

## 2021-06-20 ENCOUNTER — Other Ambulatory Visit: Payer: Self-pay

## 2021-06-20 ENCOUNTER — Encounter: Payer: Self-pay | Admitting: Orthopedic Surgery

## 2021-06-20 DIAGNOSIS — F17290 Nicotine dependence, other tobacco product, uncomplicated: Secondary | ICD-10-CM | POA: Diagnosis not present

## 2021-06-20 DIAGNOSIS — I10 Essential (primary) hypertension: Secondary | ICD-10-CM | POA: Diagnosis not present

## 2021-06-20 DIAGNOSIS — M1712 Unilateral primary osteoarthritis, left knee: Secondary | ICD-10-CM | POA: Diagnosis not present

## 2021-06-20 DIAGNOSIS — Z79899 Other long term (current) drug therapy: Secondary | ICD-10-CM | POA: Diagnosis not present

## 2021-06-20 DIAGNOSIS — I9742 Intraoperative hemorrhage and hematoma of a circulatory system organ or structure complicating other procedure: Secondary | ICD-10-CM | POA: Diagnosis not present

## 2021-06-20 LAB — CBC
HCT: 31.1 % — ABNORMAL LOW (ref 39.0–52.0)
Hemoglobin: 10.4 g/dL — ABNORMAL LOW (ref 13.0–17.0)
MCH: 31.3 pg (ref 26.0–34.0)
MCHC: 33.4 g/dL (ref 30.0–36.0)
MCV: 93.7 fL (ref 80.0–100.0)
Platelets: 207 10*3/uL (ref 150–400)
RBC: 3.32 MIL/uL — ABNORMAL LOW (ref 4.22–5.81)
RDW: 12.8 % (ref 11.5–15.5)
WBC: 10 10*3/uL (ref 4.0–10.5)
nRBC: 0 % (ref 0.0–0.2)

## 2021-06-20 LAB — BASIC METABOLIC PANEL
Anion gap: 4 — ABNORMAL LOW (ref 5–15)
BUN: 18 mg/dL (ref 8–23)
CO2: 25 mmol/L (ref 22–32)
Calcium: 8.3 mg/dL — ABNORMAL LOW (ref 8.9–10.3)
Chloride: 102 mmol/L (ref 98–111)
Creatinine, Ser: 0.85 mg/dL (ref 0.61–1.24)
GFR, Estimated: >60 mL/min (ref >60)
Glucose, Bld: 137 mg/dL — ABNORMAL HIGH (ref 70–99)
Potassium: 4.1 mmol/L (ref 3.5–5.1)
Sodium: 131 mmol/L — ABNORMAL LOW (ref 135–145)

## 2021-06-20 MED ORDER — ONDANSETRON HCL 4 MG PO TABS
4.0000 mg | ORAL_TABLET | Freq: Three times a day (TID) | ORAL | 1 refills | Status: DC | PRN
  Filled 2021-06-20: qty 20, 7d supply, fill #0

## 2021-06-20 MED ORDER — OXYCODONE HCL 10 MG PO TABS
10.0000 mg | ORAL_TABLET | ORAL | 0 refills | Status: DC | PRN
  Filled 2021-06-20: qty 60, 7d supply, fill #0

## 2021-06-20 MED ORDER — APIXABAN 5 MG PO TABS
5.0000 mg | ORAL_TABLET | Freq: Two times a day (BID) | ORAL | 0 refills | Status: DC
  Filled 2021-06-20: qty 60, 30d supply, fill #0

## 2021-06-20 MED ORDER — PANTOPRAZOLE SODIUM 40 MG PO TBEC
40.0000 mg | DELAYED_RELEASE_TABLET | Freq: Every day | ORAL | Status: DC
  Administered 2021-06-20 – 2021-06-21 (×2): 40 mg via ORAL
  Filled 2021-06-20 (×2): qty 1

## 2021-06-20 NOTE — Anesthesia Postprocedure Evaluation (Signed)
Anesthesia Post Note  Patient: Zachary Chung  Procedure(s) Performed: LEFT TOTAL KNEE ARTHROPLASTY (Left: Knee) STITCH AND REPAIR OF ARTERY (Left: Knee)  Patient location during evaluation: Nursing Unit Anesthesia Type: Spinal Level of consciousness: oriented and awake and alert Pain management: pain level controlled Vital Signs Assessment: post-procedure vital signs reviewed and stable Respiratory status: spontaneous breathing and respiratory function stable Cardiovascular status: blood pressure returned to baseline and stable Postop Assessment: no headache, no backache, no apparent nausea or vomiting and patient able to bend at knees Anesthetic complications: no   No notable events documented.   Last Vitals:  Vitals:   06/20/21 0531 06/20/21 0742  BP: 128/88 137/85  Pulse: 69 71  Resp: 20 18  Temp: 36.8 C 36.9 C  SpO2: 100% 99%    Last Pain:  Vitals:   06/20/21 0742  TempSrc: Oral  PainSc:                  Caryl Asp

## 2021-06-20 NOTE — Progress Notes (Signed)
Physical Therapy Treatment Patient Details Name: Zachary Chung MRN: [id number] DOB: [birth year] Today's Date: 06/20/2021   History of Present Illness 60 y.o. male s/p L TKA on 06/19/21.    PT Comments    Pt received standing in room, RN in room administering meds. Pt ambulated 55ft to/from staircase with assist level progressing from CGA to SUP. Pt navigated a full flight of stairs safely indicating ability to perform at home. Therapeutic exercises in TKA packet were reviewed following ambulation and stairs - both knee ROM and muscle fiber recruitment improved with increased reps. Pt would benefit from continued skilled PT to improve functional mobility and promote optimal return to PLOF.   Recommendations for follow up therapy are one component of a multi-disciplinary discharge planning process, led by the attending physician.  Recommendations may be updated based on patient status, additional functional criteria and insurance authorization.  Follow Up Recommendations  Home health PT     Assistance Recommended at Discharge Intermittent Supervision/Assistance  Equipment Recommendations  None recommended by PT    Recommendations for Other Services       Precautions / Restrictions Precautions Precautions: Fall Restrictions Weight Bearing Restrictions: Yes LLE Weight Bearing: Weight bearing as tolerated Other Position/Activity Restrictions: L TKA     Mobility  Bed Mobility Overal bed mobility: Needs Assistance Bed Mobility: Sit to Supine       Sit to supine: HOB elevated;Min assist   General bed mobility comments: MIN A to manage LLE back to bed    Transfers Overall transfer level: Needs assistance Equipment used: Rolling walker (2 wheels) Transfers: Sit to/from Stand Sit to Stand: Min guard;From elevated surface           General transfer comment: CGA to sit down on elevated EOB. Pt was standing upon arrival to room.    Ambulation/Gait Ambulation/Gait  assistance: Min guard Gait Distance (Feet): 80 Feet Assistive device: Rolling walker (2 wheels) Gait Pattern/deviations: Step-through pattern;Decreased stance time - left;Decreased weight shift to left Gait velocity: decreased     General Gait Details: 52ft x2 reps to/from staircase. CGA progressed to SUP.   Stairs Stairs: Yes Stairs assistance: Min guard Stair Management: One rail Left;Step to pattern;Forwards Number of Stairs: 14 General stair comments: 2 hands on L rail to ascend; 2 hands progressing to 1 hand on L rail to descend. CGA for safety.   Wheelchair Mobility    Modified Rankin (Stroke Patients Only)       Balance Overall balance assessment: Needs assistance Sitting-balance support: Feet supported Sitting balance-Leahy Scale: Normal     Standing balance support: Bilateral upper extremity supported;During functional activity Standing balance-Leahy Scale: Fair Standing balance comment: does not rely on RW during static standing; does rely on RW during ambulation                            Cognition Arousal/Alertness: Awake/alert Behavior During Therapy: WFL for tasks assessed/performed Overall Cognitive Status: Within Functional Limits for tasks assessed                                          Exercises Total Joint Exercises Ankle Circles/Pumps: AROM;Strengthening;Both;15 reps;Supine Quad Sets: AROM;Strengthening;Left;15 reps;Supine Short Arc Quad: AROM;Strengthening;Left;15 reps;Supine Heel Slides: AROM;Strengthening;Left;15 reps;Supine Hip ABduction/ADduction: AAROM;Strengthening;Left;15 reps;Supine (AAROM to support weight of LE) Straight Leg Raises: AAROM;Strengthening;Left;15 reps;Supine Long Arc Quad: AROM;Strengthening;Left;15 reps;Seated Knee  Flexion: AROM;Strengthening;Left;Seated    General Comments        Pertinent Vitals/Pain Pain Assessment: 0-10 Pain Score: 6  Pain Location: anterior knee, left Pain  Descriptors / Indicators: Aching;Sharp;Grimacing Pain Intervention(s): Limited activity within patient's tolerance;Monitored during session;Premedicated before session;Repositioned;Ice applied    Home Living                          Prior Function            PT Goals (current goals can now be found in the care plan section) Acute Rehab PT Goals Patient Stated Goal: to go home with wife PT Goal Formulation: With patient Time For Goal Achievement: 07/03/21 Potential to Achieve Goals: Good    Frequency    BID      PT Plan      Co-evaluation              AM-PAC PT "6 Clicks" Mobility   Outcome Measure  Help needed turning from your back to your side while in a flat bed without using bedrails?: None Help needed moving from lying on your back to sitting on the side of a flat bed without using bedrails?: A Little Help needed moving to and from a bed to a chair (including a wheelchair)?: A Little Help needed standing up from a chair using your arms (e.g., wheelchair or bedside chair)?: A Little Help needed to walk in hospital room?: A Little Help needed climbing 3-5 steps with a railing? : A Little 6 Click Score: 19    End of Session Equipment Utilized During Treatment: Gait belt Activity Tolerance: Patient tolerated treatment well Patient left: in bed;with call bell/phone within reach;with family/visitor present Nurse Communication: Mobility status;Precautions PT Visit Diagnosis: Difficulty in walking, not elsewhere classified (R26.2);Muscle weakness (generalized) (M62.81)     Time: 5726-2035 PT Time Calculation (min) (ACUTE ONLY): 40 min  Charges:  $Therapeutic Exercise: 23-37 mins $Therapeutic Activity: 8-22 mins                     Patrina Levering PT, DPT 06/20/21 12:49 PM [phone]

## 2021-06-20 NOTE — Progress Notes (Signed)
  Subjective:  POD #1 s/p left total knee arthroplasty.   Patient reports that he still has left knee pain which is significant but improving compared to yesterday.  Patient states he is making progress with physical therapy.  Objective:   VITALS:   Vitals:   06/19/21 2336 06/20/21 0531 06/20/21 0742 06/20/21 1126  BP: 138/87 128/88 137/85 (!) 150/89  Pulse: 83 69 71 79  Resp: 17 20 18 18   Temp: 97.8 F (36.6 C) 98.3 F (36.8 C) 98.5 F (36.9 C) 98.6 F (37 C)  TempSrc: Oral  Oral   SpO2: 97% 100% 99% 97%  Weight:      Height:        PHYSICAL EXAM: Left lower extremity: Neurovascular intact Sensation intact distally Intact pulses distally Dorsiflexion/Plantar flexion intact No cellulitis present Compartment soft  LABS  Results for orders placed or performed during the hospital encounter of 06/19/21 (from the past 24 hour(s))  CBC     Status: Abnormal   Collection Time: 06/20/21 12:52 AM  Result Value Ref Range   WBC 10.0 4.0 - 10.5 K/uL   RBC 3.32 (L) 4.22 - 5.81 MIL/uL   Hemoglobin 10.4 (L) 13.0 - 17.0 g/dL   HCT 31.1 (L) 39.0 - 52.0 %   MCV 93.7 80.0 - 100.0 fL   MCH 31.3 26.0 - 34.0 pg   MCHC 33.4 30.0 - 36.0 g/dL   RDW 12.8 11.5 - 15.5 %   Platelets 207 150 - 400 K/uL   nRBC 0.0 0.0 - 0.2 %  Basic metabolic panel     Status: Abnormal   Collection Time: 06/20/21 12:52 AM  Result Value Ref Range   Sodium 131 (L) 135 - 145 mmol/L   Potassium 4.1 3.5 - 5.1 mmol/L   Chloride 102 98 - 111 mmol/L   CO2 25 22 - 32 mmol/L   Glucose, Bld 137 (H) 70 - 99 mg/dL   BUN 18 8 - 23 mg/dL   Creatinine, Ser 0.85 0.61 - 1.24 mg/dL   Calcium 8.3 (L) 8.9 - 10.3 mg/dL   GFR, Estimated >60 >60 mL/min   Anion gap 4 (L) 5 - 15    DG Knee Left Port  Result Date: 06/19/2021 CLINICAL DATA:  Status post left knee arthroplasty. EXAM: PORTABLE LEFT KNEE - 1-2 VIEW COMPARISON:  None. FINDINGS: The left femoral and tibial components are well situated. Expected postoperative changes  are noted in the soft tissues anteriorly. IMPRESSION: Status post left total knee arthroplasty. Electronically Signed   By: Marijo Conception M.D.   On: 06/19/2021 13:12    Assessment/Plan: 1 Day Post-Op   Principal Problem:   S/P TKR (total knee replacement) using cement, left  Patient improving postop.  Postoperative pain is getting better.  Patient continues to make progress of physical therapy.  I am recommending he stay overnight given that he is still having some significant left knee pain.  Continue physical therapy in the meantime.  Patient will take enteric-coated aspirin 325 mg p.o. twice daily for DVT prophylaxis upon discharge but continue Lovenox 40 mg daily while an inpatient.  Encourage incentive spirometry while awake    Thornton Park , MD 06/20/2021, 1:36 PM

## 2021-06-20 NOTE — Progress Notes (Signed)
Physical Therapy Treatment Patient Details Name: Zachary Chung MRN: [id number] DOB: 10/02/1960 Today's Date: 06/20/2021   History of Present Illness 60 y.o. male s/p L TKA on 06/19/21.    PT Comments    Pt received supine in bed, agree to therapy.  Wife at bedside. Pt increased ambulation distance to 389ft and decreased assist level to SUP. Pt continues to perform STS from significantly elevated surface due to tall stature; assist level with STS has also decreased to SUP. ROM measurements taken once back to bed - pt limited in both flexion and extension (E 10*, F 72*); pt states flexion feels limited due to tightness within the quads rather than the capsule. Would benefit from skilled PT to address above deficits and promote optimal return to PLOF.   Recommendations for follow up therapy are one component of a multi-disciplinary discharge planning process, led by the attending physician.  Recommendations may be updated based on patient status, additional functional criteria and insurance authorization.  Follow Up Recommendations  Home health PT     Assistance Recommended at Discharge Intermittent Supervision/Assistance  Equipment Recommendations  None recommended by PT    Recommendations for Other Services       Precautions / Restrictions Precautions Precautions: Fall Restrictions Weight Bearing Restrictions: Yes LLE Weight Bearing: Weight bearing as tolerated Other Position/Activity Restrictions: L TKA     Mobility  Bed Mobility Overal bed mobility: Needs Assistance Bed Mobility: Sit to Supine     Supine to sit: Supervision;HOB elevated Sit to supine: HOB elevated;Supervision   General bed mobility comments: SUP for safety. Pt managing LLE this session; pt educated on RLE assisting left if needed.    Transfers Overall transfer level: Needs assistance Equipment used: Rolling walker (2 wheels) Transfers: Sit to/from Stand Sit to Stand: From elevated  surface;Supervision           General transfer comment: SUP for safety; continuing to use elevated surface due to pt tall stature    Ambulation/Gait Ambulation/Gait assistance: Supervision Gait Distance (Feet): 380 Feet Assistive device: Rolling walker (2 wheels) Gait Pattern/deviations: Step-through pattern;Decreased stance time - left;Decreased weight shift to left Gait velocity: decreased     General Gait Details: 36ft in hallway using RW. decreased WB through LLE with increased UE support during left stance phase. gait velocity improving   Stairs Stairs: Yes Stairs assistance: Min guard Stair Management: One rail Left;Step to pattern;Forwards Number of Stairs: 14 General stair comments: 2 hands on L rail to ascend; 2 hands progressing to 1 hand on L rail to descend. CGA for safety.   Wheelchair Mobility    Modified Rankin (Stroke Patients Only)       Balance Overall balance assessment: Needs assistance Sitting-balance support: Feet supported Sitting balance-Leahy Scale: Normal     Standing balance support: Bilateral upper extremity supported;During functional activity Standing balance-Leahy Scale: Fair Standing balance comment: does not rely on RW during static standing - does weight shift onto RLE during static standing; does rely on RW during ambulation                            Cognition Arousal/Alertness: Awake/alert Behavior During Therapy: WFL for tasks assessed/performed Overall Cognitive Status: Within Functional Limits for tasks assessed  Exercises Total Joint Exercises Ankle Circles/Pumps: AROM;Strengthening;Both;15 reps;Supine Quad Sets: AROM;Strengthening;Left;15 reps;Supine Short Arc Quad: AROM;Strengthening;Left;15 reps;Supine Heel Slides: AROM;Strengthening;Left;15 reps;Supine Hip ABduction/ADduction: AAROM;Strengthening;Left;15 reps;Supine (AAROM to support weight of  LE) Straight Leg Raises: AAROM;Strengthening;Left;15 reps;Supine Long Arc Quad: AROM;Strengthening;Left;15 reps;Seated Knee Flexion: PROM;Left;5 reps (gentle PROM prior to goni measurement) Goniometric ROM: Extension: 10*, Flexion: 72* (AROM - no over pressure) Other Exercises Other Exercises: Pt/spouse instructed in home/routines modifications, AE/DME for ADL, falls prevention, pet care considerations, polar care mgt, and KI mgt.    General Comments        Pertinent Vitals/Pain Pain Assessment: 0-10 Pain Score: 7  Pain Location: L knee Pain Descriptors / Indicators: Aching;Sharp;Grimacing Pain Intervention(s): Limited activity within patient's tolerance;Monitored during session;Repositioned;Ice applied;Patient requesting pain meds-RN notified;RN gave pain meds during session    Home Living Family/patient expects to be discharged to:: Private residence Living Arrangements: Spouse/significant other Available Help at Discharge: Family;Available 24 hours/day Type of Home: House Home Access: Stairs to enter Entrance Stairs-Rails: Right;Left Entrance Stairs-Number of Steps: 5 Alternate Level Stairs-Number of Steps: 13+3 Home Layout: Multi-level;Bed/bath upstairs Home Equipment: Conservation officer, nature (2 wheels);Toilet riser Additional Comments: borrowing RW (vs SW, spouse will double check) and elevated toilet seat with pushup bars    Prior Function            PT Goals (current goals can now be found in the care plan section) Acute Rehab PT Goals Patient Stated Goal: to go home with wife PT Goal Formulation: With patient Time For Goal Achievement: 07/03/21 Potential to Achieve Goals: Good    Frequency    BID      PT Plan      Co-evaluation              AM-PAC PT "6 Clicks" Mobility   Outcome Measure  Help needed turning from your back to your side while in a flat bed without using bedrails?: None Help needed moving from lying on your back to sitting on the side of  a flat bed without using bedrails?: None Help needed moving to and from a bed to a chair (including a wheelchair)?: A Little Help needed standing up from a chair using your arms (e.g., wheelchair or bedside chair)?: A Little Help needed to walk in hospital room?: A Little Help needed climbing 3-5 steps with a railing? : A Little 6 Click Score: 20    End of Session Equipment Utilized During Treatment: Gait belt Activity Tolerance: Patient tolerated treatment well Patient left: in bed;with call bell/phone within reach;with family/visitor present Nurse Communication: Mobility status;Precautions (IV dislodged - needs replacement) PT Visit Diagnosis: Difficulty in walking, not elsewhere classified (R26.2);Muscle weakness (generalized) (M62.81)     Time: 1347-1410 PT Time Calculation (min) (ACUTE ONLY): 23 min  Charges:  $Gait Training: 8-22 mins $Therapeutic Exercise: 8-22 mins $Therapeutic Activity: 8-22 mins                     Patrina Levering PT, DPT 06/20/21 2:49 PM [phone]

## 2021-06-20 NOTE — Evaluation (Signed)
Occupational Therapy Evaluation Patient Details Name: Zachary Chung MRN: [id number] DOB: 10-27-1960 Today's Date: 06/20/2021   History of Present Illness 60 y.o. male s/p L TKA on 06/19/21.   Clinical Impression   Pt seen for OT evaluation this date, POD#1 from above surgery. Pt was independent in all ADL prior to surgery and working full time. Pt lives with his spouse and dog in a 2 story home with 5 STE. Pt is eager to return to PLOF with less pain and improved safety and independence. Pt currently requires minimal assist for LB dressing and bathing while in seated position due to pain and limited AROM of L knee. Pt/spouse instructed in polar care mgt, falls prevention strategies including pet care considerations, home/routines modifications, and DME/AE for LB bathing and dressing tasks. Handout provided to support recall and carryover. Pt/spouse verbalized understanding and denied additional questions/concerns. Do not currently anticipate any additional skilled OT needs at this time. Will sign off.   Recommendations for follow up therapy are one component of a multi-disciplinary discharge planning process, led by the attending physician.  Recommendations may be updated based on patient status, additional functional criteria and insurance authorization.   Follow Up Recommendations  No OT follow up    Assistance Recommended at Discharge PRN  Functional Status Assessment     Equipment Recommendations  Tub/shower seat    Recommendations for Other Services       Precautions / Restrictions Precautions Precautions: Fall Restrictions Weight Bearing Restrictions: Yes LLE Weight Bearing: Weight bearing as tolerated Other Position/Activity Restrictions: L TKA      Mobility Bed Mobility    General bed mobility comments: deferred 2/2 L knee pain    Transfers       Balance                            ADL either performed or assessed with clinical judgement   ADL  Overall ADL's : Needs assistance/impaired                                       General ADL Comments: Pt currently requires MIN A for LB ADL 2/2 decreased strength/ROM/pain in L knee. Pt/spouse endorse spouse able to provide needed level of assist.     Vision         Perception     Praxis      Pertinent Vitals/Pain Pain Assessment: 0-10 Pain Score: 7  Pain Location: L knee Pain Descriptors / Indicators: Aching;Sharp;Grimacing Pain Intervention(s): Limited activity within patient's tolerance;Monitored during session;Repositioned;Ice applied;Patient requesting pain meds-RN notified;RN gave pain meds during session     Hand Dominance     Extremity/Trunk Assessment Upper Extremity Assessment Upper Extremity Assessment: Defer to OT evaluation   Lower Extremity Assessment Lower Extremity Assessment: LLE deficits/detail LLE Deficits / Details: s/p L TKA, performs SLR without assist   Cervical / Trunk Assessment Cervical / Trunk Assessment: Normal   Communication Communication Communication: No difficulties   Cognition Arousal/Alertness: Awake/alert Behavior During Therapy: WFL for tasks assessed/performed Overall Cognitive Status: Within Functional Limits for tasks assessed                                       General Comments       Exercises Other Exercises:  Pt/spouse instructed in home/routines modifications, AE/DME for ADL, falls prevention, pet care considerations, polar care mgt, and KI mgt.   Shoulder Instructions      Home Living Family/patient expects to be discharged to:: Private residence Living Arrangements: Spouse/significant other Available Help at Discharge: Family;Available 24 hours/day Type of Home: House Home Access: Stairs to enter [employer] of Steps: 5 Entrance Stairs-Rails: Right;Left Home Layout: Multi-level;Bed/bath upstairs Alternate Level Stairs-Number of Steps: 13+3 Alternate Level  Stairs-Rails: Left Bathroom Shower/Tub: Walk-in shower     Bathroom Accessibility: No   Home Equipment: Conservation officer, nature (2 wheels);Toilet riser   Additional Comments: borrowing RW (vs SW, spouse will double check) and elevated toilet seat with pushup bars      Prior Functioning/Environment Prior Level of Function : Independent/Modified Independent;Working/employed;Driving             Mobility Comments: Independent with all mobility, does not use AD. 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 ADLs Comments: Independent        OT Problem List: Decreased strength;Pain;Decreased range of motion      OT Treatment/Interventions:      OT Goals(Current goals can be found in the care plan section) Acute Rehab OT Goals Patient Stated Goal: go home and recover OT Goal Formulation: All assessment and education complete, DC therapy  OT Frequency:     Barriers to D/C:            Co-evaluation              AM-PAC OT "6 Clicks" Daily Activity     Outcome Measure Help from another person eating meals?: None Help from another person taking care of personal grooming?: None Help from another person toileting, which includes using toliet, bedpan, or urinal?: A Little Help from another person bathing (including washing, rinsing, drying)?: A Little Help from another person to put on and taking off regular upper body clothing?: None Help from another person to put on and taking off regular lower body clothing?: A Little 6 Click Score: 21   End of Session Nurse Communication: Patient requests pain meds  Activity Tolerance: Patient tolerated treatment well Patient left: in bed;with call bell/phone within reach;with nursing/sitter in room;with family/visitor present;Other (comment);with SCD's reapplied (KI in place)  OT Visit Diagnosis: Other abnormalities of gait and mobility (R26.89);Pain Pain - Right/Left: Left Pain - part of body: Knee                Time: 1114-1140 OT Time  Calculation (min): 26 min Charges:  OT General Charges $OT Visit: 1 Visit OT Evaluation $OT Eval Low Complexity: 1 Low OT Treatments $Self Care/Home Management : 8-22 mins  Ardeth Perfect., MPH, MS, OTR/L ascom [phone] 06/20/21, 1:39 PM

## 2021-06-20 NOTE — TOC Initial Note (Signed)
Transition of Care [facility]) - Initial/Assessment Note    Patient Details  Name: Zachary Chung MRN: [id number] Date of Birth: Apr 13, 1961  [facility]) CM/SW Contact:    Pete Pelt, RN Phone Number: 06/20/2021, 4:18 PM  Clinical Narrative:    Patient lives at home with wife.   No current home concerns.  States he has a walker, wife will check if walker has wheels, if not he will request walker to go home with.  Patient would like home health with [facility].  He states he will speak to his MD about this tomorrow. He states he expects to be discharged tomorrow after home antibiotics. TOC contact information provided TOC to folllow for needs.               Expected Discharge Plan: [place]     Patient Goals and CMS Choice        Expected Discharge Plan and Services Expected Discharge Plan: [place]   Discharge Planning Services: CM Consult Post Acute Care Choice: Home Health Living arrangements for the past 2 months: Single Family Home Expected Discharge Date: 06/21/21                                    Prior Living Arrangements/Services Living arrangements for the past 2 months: Single Family Home Lives with:: Self, Spouse Patient language and need for interpreter reviewed:: Yes (No interpreter required) Do you feel safe going back to the place where you live?: Yes      Need for Family Participation in Patient Care: Yes (Comment) Care giver support system in place?: Yes (comment)   Criminal Activity/Legal Involvement Pertinent to Current Situation/Hospitalization: No - Comment as needed  Activities of Daily Living Home Assistive Devices/Equipment: None ADL Screening (condition at time of admission) Patient's cognitive ability adequate to safely complete daily activities?: Yes Is the patient deaf or have difficulty hearing?: No Does the patient have difficulty seeing, even when wearing glasses/contacts?:  No Does the patient have difficulty concentrating, remembering, or making decisions?: No Patient able to express need for assistance with ADLs?: Yes Does the patient have difficulty dressing or bathing?: No Independently performs ADLs?: Yes (appropriate for developmental age) Does the patient have difficulty walking or climbing stairs?: Yes Weakness of Legs: None Weakness of Arms/Hands: None  Permission Sought/Granted Permission sought to share information with : Case Manager Permission granted to share information with : Yes, Verbal Permission Granted              Emotional Assessment Appearance:: Appears stated age Attitude/Demeanor/Rapport: Gracious, Engaged Affect (typically observed): Pleasant, Appropriate Orientation: : Oriented to Self, Oriented to Place, Oriented to  Time, Oriented to Situation Alcohol / Substance Use: Not Applicable Psych Involvement: No (comment)  Admission diagnosis:  S/P TKR (total knee replacement) using cement, left [Z96.652] Patient Active Problem List   Diagnosis Date Noted   S/P TKR (total knee replacement) using cement, left 06/19/2021   Tibial DVT (deep venous thrombosis) ([place]) 12/26/2020   Rectal polyp 05/19/2017   Hypertrophy of both inferior nasal turbinates 03/16/2017   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

## 2021-06-21 DIAGNOSIS — I10 Essential (primary) hypertension: Secondary | ICD-10-CM | POA: Diagnosis not present

## 2021-06-21 DIAGNOSIS — Z79899 Other long term (current) drug therapy: Secondary | ICD-10-CM | POA: Diagnosis not present

## 2021-06-21 DIAGNOSIS — M1712 Unilateral primary osteoarthritis, left knee: Secondary | ICD-10-CM | POA: Diagnosis not present

## 2021-06-21 DIAGNOSIS — I9742 Intraoperative hemorrhage and hematoma of a circulatory system organ or structure complicating other procedure: Secondary | ICD-10-CM | POA: Diagnosis not present

## 2021-06-21 DIAGNOSIS — F17290 Nicotine dependence, other tobacco product, uncomplicated: Secondary | ICD-10-CM | POA: Diagnosis not present

## 2021-06-21 LAB — CBC
HCT: 28.7 % — ABNORMAL LOW (ref 39.0–52.0)
Hemoglobin: 9.7 g/dL — ABNORMAL LOW (ref 13.0–17.0)
MCH: 31.9 pg (ref 26.0–34.0)
MCHC: 33.8 g/dL (ref 30.0–36.0)
MCV: 94.4 fL (ref 80.0–100.0)
Platelets: 188 10*3/uL (ref 150–400)
RBC: 3.04 MIL/uL — ABNORMAL LOW (ref 4.22–5.81)
RDW: 13 % (ref 11.5–15.5)
WBC: 6.9 10*3/uL (ref 4.0–10.5)
nRBC: 0 % (ref 0.0–0.2)

## 2021-06-21 NOTE — Discharge Summary (Signed)
Physician Discharge Summary  Patient ID: Zachary Chung MRN: [id number] DOB/AGE: 62-Apr-1962 62 y.o.  Admit date: 06/19/2021 Discharge date: 06/21/2021  Admission Diagnoses:  Left Knee Osteoarthritis S/P TKR (total knee replacement) using cement, left  Discharge Diagnoses:  Left Knee Osteoarthritis Principal Problem:   S/P TKR (total knee replacement) using cement, left   Past Medical History:  Diagnosis Date   Adenomatous polyp of ascending colon    Anxiety    Arthritis    Depression    H/O NO MEDS   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)    left calf   ED (erectile dysfunction)    Elevated PSA    GERD (gastroesophageal reflux disease)    Hypertension    Pneumonia 1994   Sleep apnea    wears BI-PAP   Wears glasses     Surgeries: Procedure(s): LEFT TOTAL KNEE ARTHROPLASTY STITCH AND REPAIR OF ARTERY on 06/19/2021   Consultants (if any): Treatment Team:  Thornton Park, MD  Discharged Condition: Improved  Hospital Course: Zachary Chung is an 60 y.o. male who was admitted 06/19/2021 with a diagnosis of  Left Knee Osteoarthritis S/P TKR (total knee replacement) using cement, left and went to the operating room on 06/19/2021 and underwent a left total knee arthroplasty with suture repair of a bleeding vessel by Dr. Lucky Cowboy.Marland Kitchen    He was given perioperative antibiotics:  Anti-infectives (From admission, onward)    Start     Dose/Rate Route Frequency Ordered Stop   06/19/21 1600  ceFAZolin (ANCEF) IVPB 2g/100 mL premix        2 g 200 mL/hr over 30 Minutes Intravenous Every 6 hours 06/19/21 1446 06/20/21 0122   06/19/21 0655  ceFAZolin (ANCEF) 2-4 GM/100ML-% IVPB       Note to Pharmacy: Trudie Reed S: cabinet override      06/19/21 0655 06/19/21 0825   06/19/21 0654  clindamycin (CLEOCIN) 600 MG/50ML IVPB       Note to Pharmacy: Trudie Reed S: cabinet override      06/19/21 0654 06/19/21 0826   06/19/21 0600  ceFAZolin (ANCEF) IVPB 2g/100 mL premix        2 g 200  mL/hr over 30 Minutes Intravenous On call to O.R. 06/19/21 0355 06/19/21 0831   06/19/21 0400  clindamycin (CLEOCIN) IVPB 600 mg        600 mg 100 mL/hr over 30 Minutes Intravenous  Once 06/19/21 0355 06/19/21 0834     .  He was given sequential compression devices, early ambulation, and lovenox for DVT prophylaxis.  Patient was admitted postoperatively for neurovascular monitoring and pain control.  Patient had postoperative pain necessitating a 2-day hospital stay postop.  Patient began physical therapy on the day of surgery and continue to make good progress throughout his hospital stay.  Patient remained hemodynamically stable throughout his hospitalization.  He benefited maximally from the hospital stay and there were no complications.  Given his clinical improvement he was prepared for discharge home.    Recent vital signs:  Vitals:   06/21/21 0903 06/21/21 1146  BP: (!) 145/94 (!) 153/95  Pulse: 97 87  Resp: 20 19  Temp: 98.7 F (37.1 C) 98.7 F (37.1 C)  SpO2: 94% 98%    Recent laboratory studies:  Lab Results  Component Value Date   HGB 9.7 (L) 06/21/2021   HGB 10.4 (L) 06/20/2021   HGB 14.3 06/06/2021   Lab Results  Component Value Date   WBC 6.9 06/21/2021  PLT 188 06/21/2021   Lab Results  Component Value Date   INR 1.0 06/06/2021   Lab Results  Component Value Date   NA 131 (L) 06/20/2021   K 4.1 06/20/2021   CL 102 06/20/2021   CO2 25 06/20/2021   BUN 18 06/20/2021   CREATININE 0.85 06/20/2021   GLUCOSE 137 (H) 06/20/2021    Discharge Medications:   Allergies as of 06/21/2021       Reactions   Tetracyclines & Related Rash        Medication List     TAKE these medications    ALPRAZolam 1 MG tablet Commonly known as: XANAX Take 1 tablet by mouth twice a day What changed:  how much to take when to take this reasons to take this   amLODipine 5 MG tablet Commonly known as: NORVASC Take 1 tablet by mouth once daily (take 1 tablet  (5 mg) by oral route once daily) What changed:  how much to take how to take this when to take this   Eliquis 5 MG Tabs tablet Generic drug: apixaban Take 1 tablet (5 mg total) by mouth 2 (two) times daily.   ondansetron 4 MG tablet Commonly known as: ZOFRAN Take 1 tablet (4 mg total) by mouth every 8 (eight) hours as needed for nausea.   ondansetron 8 MG disintegrating tablet Commonly known as: ZOFRAN-ODT take 1 tablet (8 mg) and place on top of the tongue where it will dissolve, then swallow by oral route every 8 hours   Oxycodone HCl 10 MG Tabs Take 1 to 1 & 1/2 tablets by mouth every 4 hours as needed for severe pain (pain score 7-10) (Take 1-1.5 tablets (10-15 mg total) by mouth every 4 (four) hours as needed for severe pain (pain score 7-10).) What changed:  how much to take how to take this when to take this reasons to take this Another medication with the same name was removed. Continue taking this medication, and follow the directions you see here.   tadalafil 20 MG tablet Commonly known as: CIALIS TAKE 1 TABLET BY MOUTH DAILY AS NEEDED FOR ED. TAKE 30-60 MINUTES PRIOR TO INTERCOURSE.   telmisartan 80 MG tablet Commonly known as: MICARDIS TAKE 1 TABLET BY MOUTH ONCE DAILY What changed:  how much to take when to take this        Diagnostic Studies: DG Knee Left Port  Result Date: 06/19/2021 CLINICAL DATA:  Status post left knee arthroplasty. EXAM: PORTABLE LEFT KNEE - 1-2 VIEW COMPARISON:  None. FINDINGS: The left femoral and tibial components are well situated. Expected postoperative changes are noted in the soft tissues anteriorly. IMPRESSION: Status post left total knee arthroplasty. Electronically Signed   By: Marijo Conception M.D.   On: 06/19/2021 13:12    Disposition: Discharge disposition: 01-Home or Self Care       Discharge Instructions     Call MD / Call 911   Complete by: As directed    If you experience chest pain or shortness of breath,  CALL 911 and be transported to the hospital emergency room.  If you develope a fever above 101 F, pus (white drainage) or increased drainage or redness at the wound, or calf pain, call your surgeon's office.   Constipation Prevention   Complete by: As directed    Drink plenty of fluids.  Prune juice may be helpful.  You may use a stool softener, such as Colace (over the counter) 100 mg twice a  day.  Use MiraLax (over the counter) for constipation as needed.   Diet general   Complete by: As directed    Discharge instructions   Complete by: As directed    The patient may continue to bear weight on the left lower extremity with use of a walker. The patient should continue to use TED stockings until follow-up. Patient should remove the TED stockings at night for sleep. The patient needs to continue to elevate the left lower extremity whenever possible. The knee immobilizer should be used at night. The patient may remove the knee immobilizer to perform exercises or sit in a chair during the day.  Patient should not place a pillow under their knee. The Polar Care may be used by the patient for comfort.  The dressing should remain on until follow up in the office.   The patient must cover the left knee dressing/incision during showers with a plastic bag or Saran wrap.  The patient will take eliquis 5mg  twice a day for blood clot prevention and continue to work on knee range of motion exercises at home as instructed by physical therapy until follow-up in the office.   Driving restrictions   Complete by: As directed    No driving until follow up with Dr. Mack Guise   Increase activity slowly as tolerated   Complete by: As directed    Lifting restrictions   Complete by: As directed    No lifting for 12-16 weeks   Post-operative opioid taper instructions:   Complete by: As directed    POST-OPERATIVE OPIOID TAPER INSTRUCTIONS: It is important to wean off of your opioid medication as soon as possible. If you  do not need pain medication after your surgery it is ok to stop day one. Opioids include: Codeine, Hydrocodone(Norco, Vicodin), Oxycodone(Percocet, oxycontin) and hydromorphone amongst others.  Long term and even short term use of opiods can cause: Increased pain response Dependence Constipation Depression Respiratory depression And more.  Withdrawal symptoms can include Flu like symptoms Nausea, vomiting And more Techniques to manage these symptoms Hydrate well Eat regular healthy meals Stay active Use relaxation techniques(deep breathing, meditating, yoga) Do Not substitute Alcohol to help with tapering If you have been on opioids for less than two weeks and do not have pain than it is ok to stop all together.  Plan to wean off of opioids This plan should start within one week post op of your joint replacement. Maintain the same interval or time between taking each dose and first decrease the dose.  Cut the total daily intake of opioids by one tablet each day Next start to increase the time between doses. The last dose that should be eliminated is the evening dose.             Signed: Thornton Park ,MD 06/21/2021, 12:58 PM

## 2021-06-21 NOTE — Progress Notes (Signed)
Physical Therapy Treatment Patient Details Name: Zachary Chung MRN: [id number] DOB: 11-26-1960 Today's Date: 06/21/2021   History of Present Illness 60 y.o. male s/p L TKA on 06/19/21.    PT Comments    Participated in exercises as described below.  OOB and completed lap on unit with ease.  Questions answered.  No further concerns voiced.  Safe for discharge when ready with other disciplines.  Recommendations for follow up therapy are one component of a multi-disciplinary discharge planning process, led by the attending physician.  Recommendations may be updated based on patient status, additional functional criteria and insurance authorization.  Follow Up Recommendations  Home health PT     Assistance Recommended at Discharge Intermittent Supervision/Assistance  Equipment Recommendations  None recommended by PT    Recommendations for Other Services       Precautions / Restrictions Precautions Precautions: Fall Restrictions Weight Bearing Restrictions: Yes LLE Weight Bearing: Weight bearing as tolerated Other Position/Activity Restrictions: L TKA     Mobility  Bed Mobility Overal bed mobility: Needs Assistance Bed Mobility: Sit to Supine     Supine to sit: Supervision;HOB elevated Sit to supine: HOB elevated;Supervision   General bed mobility comments: self manages LLE with UE assist    Transfers Overall transfer level: Needs assistance Equipment used: Rolling walker (2 wheels) Transfers: Sit to/from Stand Sit to Stand: From elevated surface;Supervision                Ambulation/Gait Ambulation/Gait assistance: Supervision Gait Distance (Feet): 200 Feet Assistive device: Rolling walker (2 wheels) Gait Pattern/deviations: Step-through pattern;Decreased stance time - left;Decreased weight shift to left           Stairs         General stair comments: feels comfortable with steps, declines review   Wheelchair Mobility    Modified Rankin  (Stroke Patients Only)       Balance Overall balance assessment: Modified Independent             Standing balance comment: no LOB or safety concerns with RW                            Cognition Arousal/Alertness: Awake/alert Behavior During Therapy: WFL for tasks assessed/performed Overall Cognitive Status: Within Functional Limits for tasks assessed                                          Exercises Total Joint Exercises Ankle Circles/Pumps: AROM;Strengthening;Both;15 reps;Supine Quad Sets: AROM;Strengthening;Left;15 reps;Supine Short Arc Quad: AROM;Strengthening;Left;15 reps;Supine Heel Slides: AROM;Strengthening;Left;15 reps;Supine Hip ABduction/ADduction: AAROM;Strengthening;Left;15 reps;Supine (AAROM to support weight of LE) Straight Leg Raises: AAROM;Strengthening;Left;15 reps;Supine Long Arc Quad: AROM;Strengthening;Left;15 reps;Seated Knee Flexion: AROM;Strengthening;Left;Seated Goniometric ROM: 5-75    General Comments        Pertinent Vitals/Pain Pain Assessment: Faces Faces Pain Scale: Hurts little more Pain Location: L knee Pain Descriptors / Indicators: Aching;Sharp;Grimacing Pain Intervention(s): Limited activity within patient's tolerance;Monitored during session;Premedicated before session;Repositioned;Ice applied    Home Living                          Prior Function            PT Goals (current goals can now be found in the care plan section) Progress towards PT goals: Progressing toward goals    Frequency  BID      PT Plan Current plan remains appropriate    Co-evaluation              AM-PAC PT "6 Clicks" Mobility   Outcome Measure  Help needed turning from your back to your side while in a flat bed without using bedrails?: None Help needed moving from lying on your back to sitting on the side of a flat bed without using bedrails?: None Help needed moving to and from a bed to a  chair (including a wheelchair)?: None Help needed standing up from a chair using your arms (e.g., wheelchair or bedside chair)?: A Little Help needed to walk in hospital room?: A Little Help needed climbing 3-5 steps with a railing? : A Little 6 Click Score: 21    End of Session Equipment Utilized During Treatment: Gait belt Activity Tolerance: Patient tolerated treatment well Patient left: in bed;with call bell/phone within reach;with family/visitor present Nurse Communication: Mobility status;Precautions (IV dislodged - needs replacement) PT Visit Diagnosis: Difficulty in walking, not elsewhere classified (R26.2);Muscle weakness (generalized) (M62.81)     Time: 8381-8403 PT Time Calculation (min) (ACUTE ONLY): 31 min  Charges:  $Gait Training: 8-22 mins $Therapeutic Exercise: 8-22 mins                    Chesley Noon, PTA 06/21/21, 9:49 AM

## 2021-06-21 NOTE — TOC Transition Note (Signed)
Transition of Care [facility]) - CM/SW Discharge Note   Patient Details  Name: Zachary Chung MRN: [id number] Date of Birth: [birth year]  Transition of [facility]) CM/SW Contact:  Magnus Ivan, LCSW Phone Number: 06/21/2021, 10:48 AM   Clinical Narrative:    Patient to DC home today. Spoke with patient and spouse.  Patient stated he originally wanted to go to [facility] but now feels he needs HH to start with. No agency preference. Referral made to [facility]. Alvis Lemmings to see patient on Monday and call spouse's # for scheduling per patient request. Patient and spouse notified and agreeable. No additional TOC needs prior to DC.    Final next level of care: [place] Barriers to Discharge: Barriers Resolved   Patient Goals and CMS Choice Patient states their goals for this hospitalization and ongoing recovery are:: home with home health CMS Medicare.gov Compare Post Acute Care list provided to:: Patient Choice offered to / list presented to : Patient, Spouse  Discharge Placement                  Name of family member notified: patient and spouse Patient and family notified of of transfer: 06/21/21  Discharge Plan and Services   Discharge Planning Services: CM Consult Post Acute Care Choice: [place]: PT [place]: [place] Date [place]: 06/21/21   Representative spoke with at [place]: [place] Interventions     Readmission Risk Interventions No flowsheet data found.

## 2021-06-21 NOTE — Plan of Care (Signed)
  Problem: Education: Goal: Individualized Educational Video(s) Outcome: Adequate for Discharge   Problem: Activity: Goal: Ability to avoid complications of mobility impairment will improve Outcome: Adequate for Discharge Goal: Range of joint motion will improve Outcome: Adequate for Discharge   Problem: Pain Management: Goal: Pain level will decrease with appropriate interventions Outcome: Adequate for Discharge   Problem: Skin Integrity: Goal: Will show signs of wound healing Outcome: Adequate for Discharge   Problem: Clinical Measurements: Goal: Will remain free from infection Outcome: Adequate for Discharge   Problem: Coping: Goal: Level of anxiety will decrease Outcome: Adequate for Discharge   Problem: Elimination: Goal: Will not experience complications related to bowel motility Outcome: Adequate for Discharge   Problem: Pain Managment: Goal: General experience of comfort will improve Outcome: Adequate for Discharge   Problem: Safety: Goal: Ability to remain free from injury will improve Outcome: Adequate for Discharge   Problem: Skin Integrity: Goal: Risk for impaired skin integrity will decrease Outcome: Adequate for Discharge

## 2021-06-23 DIAGNOSIS — K219 Gastro-esophageal reflux disease without esophagitis: Secondary | ICD-10-CM | POA: Diagnosis not present

## 2021-06-23 DIAGNOSIS — F419 Anxiety disorder, unspecified: Secondary | ICD-10-CM | POA: Diagnosis not present

## 2021-06-23 DIAGNOSIS — Z471 Aftercare following joint replacement surgery: Secondary | ICD-10-CM | POA: Diagnosis not present

## 2021-06-23 DIAGNOSIS — F32A Depression, unspecified: Secondary | ICD-10-CM | POA: Diagnosis not present

## 2021-06-23 DIAGNOSIS — Z96652 Presence of left artificial knee joint: Secondary | ICD-10-CM | POA: Diagnosis not present

## 2021-06-24 ENCOUNTER — Other Ambulatory Visit: Payer: Self-pay

## 2021-06-24 MED ORDER — ALPRAZOLAM 1 MG PO TABS
ORAL_TABLET | Freq: Two times a day (BID) | ORAL | 3 refills | Status: DC
  Filled 2021-06-24: qty 60, 30d supply, fill #0
  Filled 2021-07-25: qty 60, 30d supply, fill #1
  Filled 2021-08-29: qty 60, 30d supply, fill #2
  Filled 2021-09-30: qty 60, 30d supply, fill #3

## 2021-06-25 DIAGNOSIS — F32A Depression, unspecified: Secondary | ICD-10-CM | POA: Diagnosis not present

## 2021-06-25 DIAGNOSIS — K219 Gastro-esophageal reflux disease without esophagitis: Secondary | ICD-10-CM | POA: Diagnosis not present

## 2021-06-25 DIAGNOSIS — Z471 Aftercare following joint replacement surgery: Secondary | ICD-10-CM | POA: Diagnosis not present

## 2021-06-25 DIAGNOSIS — F419 Anxiety disorder, unspecified: Secondary | ICD-10-CM | POA: Diagnosis not present

## 2021-06-25 DIAGNOSIS — Z96652 Presence of left artificial knee joint: Secondary | ICD-10-CM | POA: Diagnosis not present

## 2021-06-27 ENCOUNTER — Other Ambulatory Visit: Payer: Self-pay

## 2021-06-27 DIAGNOSIS — Z471 Aftercare following joint replacement surgery: Secondary | ICD-10-CM | POA: Diagnosis not present

## 2021-06-27 DIAGNOSIS — Z96652 Presence of left artificial knee joint: Secondary | ICD-10-CM | POA: Diagnosis not present

## 2021-06-27 DIAGNOSIS — F32A Depression, unspecified: Secondary | ICD-10-CM | POA: Diagnosis not present

## 2021-06-27 DIAGNOSIS — F419 Anxiety disorder, unspecified: Secondary | ICD-10-CM | POA: Diagnosis not present

## 2021-06-27 DIAGNOSIS — K219 Gastro-esophageal reflux disease without esophagitis: Secondary | ICD-10-CM | POA: Diagnosis not present

## 2021-06-27 MED ORDER — OXYCODONE HCL 10 MG PO TABS
ORAL_TABLET | ORAL | 0 refills | Status: DC
  Filled 2021-06-27: qty 40, 6d supply, fill #0

## 2021-06-27 MED ORDER — CYCLOBENZAPRINE HCL 10 MG PO TABS
ORAL_TABLET | ORAL | 1 refills | Status: DC
Start: 2021-06-27 — End: 2022-02-09
  Filled 2021-06-27: qty 30, 10d supply, fill #0
  Filled 2021-07-04: qty 30, 10d supply, fill #1

## 2021-06-30 DIAGNOSIS — R262 Difficulty in walking, not elsewhere classified: Secondary | ICD-10-CM | POA: Diagnosis not present

## 2021-06-30 DIAGNOSIS — M25562 Pain in left knee: Secondary | ICD-10-CM | POA: Diagnosis not present

## 2021-07-02 DIAGNOSIS — M1712 Unilateral primary osteoarthritis, left knee: Secondary | ICD-10-CM | POA: Diagnosis not present

## 2021-07-04 ENCOUNTER — Other Ambulatory Visit: Payer: Self-pay

## 2021-07-04 DIAGNOSIS — M25562 Pain in left knee: Secondary | ICD-10-CM | POA: Diagnosis not present

## 2021-07-04 DIAGNOSIS — R262 Difficulty in walking, not elsewhere classified: Secondary | ICD-10-CM | POA: Diagnosis not present

## 2021-07-04 MED ORDER — OXYCODONE HCL 10 MG PO TABS
ORAL_TABLET | ORAL | 0 refills | Status: DC
  Filled 2021-07-04: qty 40, 6d supply, fill #0

## 2021-07-07 ENCOUNTER — Other Ambulatory Visit: Payer: Self-pay

## 2021-07-08 DIAGNOSIS — R262 Difficulty in walking, not elsewhere classified: Secondary | ICD-10-CM | POA: Diagnosis not present

## 2021-07-08 DIAGNOSIS — M25562 Pain in left knee: Secondary | ICD-10-CM | POA: Diagnosis not present

## 2021-07-10 ENCOUNTER — Other Ambulatory Visit: Payer: Self-pay

## 2021-07-10 DIAGNOSIS — M25562 Pain in left knee: Secondary | ICD-10-CM | POA: Diagnosis not present

## 2021-07-10 DIAGNOSIS — R262 Difficulty in walking, not elsewhere classified: Secondary | ICD-10-CM | POA: Diagnosis not present

## 2021-07-10 MED ORDER — OXYCODONE HCL 10 MG PO TABS
ORAL_TABLET | ORAL | 0 refills | Status: DC
  Filled 2021-07-10: qty 40, 7d supply, fill #0

## 2021-07-11 ENCOUNTER — Other Ambulatory Visit: Payer: Self-pay

## 2021-07-15 DIAGNOSIS — M25562 Pain in left knee: Secondary | ICD-10-CM | POA: Diagnosis not present

## 2021-07-15 DIAGNOSIS — R262 Difficulty in walking, not elsewhere classified: Secondary | ICD-10-CM | POA: Diagnosis not present

## 2021-07-17 DIAGNOSIS — R262 Difficulty in walking, not elsewhere classified: Secondary | ICD-10-CM | POA: Diagnosis not present

## 2021-07-17 DIAGNOSIS — M25562 Pain in left knee: Secondary | ICD-10-CM | POA: Diagnosis not present

## 2021-07-22 DIAGNOSIS — R262 Difficulty in walking, not elsewhere classified: Secondary | ICD-10-CM | POA: Diagnosis not present

## 2021-07-22 DIAGNOSIS — M25562 Pain in left knee: Secondary | ICD-10-CM | POA: Diagnosis not present

## 2021-07-23 ENCOUNTER — Other Ambulatory Visit: Payer: Self-pay

## 2021-07-23 MED FILL — Telmisartan Tab 80 MG: ORAL | 90 days supply | Qty: 90 | Fill #2 | Status: AC

## 2021-07-24 DIAGNOSIS — F4321 Adjustment disorder with depressed mood: Secondary | ICD-10-CM | POA: Diagnosis not present

## 2021-07-25 ENCOUNTER — Other Ambulatory Visit: Payer: Self-pay

## 2021-07-25 DIAGNOSIS — R262 Difficulty in walking, not elsewhere classified: Secondary | ICD-10-CM | POA: Diagnosis not present

## 2021-07-25 DIAGNOSIS — M25562 Pain in left knee: Secondary | ICD-10-CM | POA: Diagnosis not present

## 2021-07-25 MED ORDER — NAPROXEN 500 MG PO TABS
ORAL_TABLET | ORAL | 1 refills | Status: DC
  Filled 2021-07-25: qty 60, 30d supply, fill #0
  Filled 2022-06-15: qty 60, 30d supply, fill #1

## 2021-07-25 MED ORDER — OXYCODONE HCL 10 MG PO TABS
ORAL_TABLET | ORAL | 0 refills | Status: DC
  Filled 2021-07-25: qty 30, 5d supply, fill #0

## 2021-07-26 ENCOUNTER — Other Ambulatory Visit: Payer: Self-pay

## 2021-07-28 ENCOUNTER — Other Ambulatory Visit: Payer: Self-pay

## 2021-07-28 DIAGNOSIS — M25562 Pain in left knee: Secondary | ICD-10-CM | POA: Diagnosis not present

## 2021-07-28 DIAGNOSIS — R262 Difficulty in walking, not elsewhere classified: Secondary | ICD-10-CM | POA: Diagnosis not present

## 2021-07-30 DIAGNOSIS — Z96652 Presence of left artificial knee joint: Secondary | ICD-10-CM | POA: Diagnosis not present

## 2021-08-01 DIAGNOSIS — M25562 Pain in left knee: Secondary | ICD-10-CM | POA: Diagnosis not present

## 2021-08-01 DIAGNOSIS — R262 Difficulty in walking, not elsewhere classified: Secondary | ICD-10-CM | POA: Diagnosis not present

## 2021-08-05 DIAGNOSIS — M25562 Pain in left knee: Secondary | ICD-10-CM | POA: Diagnosis not present

## 2021-08-05 DIAGNOSIS — R262 Difficulty in walking, not elsewhere classified: Secondary | ICD-10-CM | POA: Diagnosis not present

## 2021-08-07 DIAGNOSIS — R262 Difficulty in walking, not elsewhere classified: Secondary | ICD-10-CM | POA: Diagnosis not present

## 2021-08-07 DIAGNOSIS — M25562 Pain in left knee: Secondary | ICD-10-CM | POA: Diagnosis not present

## 2021-08-12 ENCOUNTER — Other Ambulatory Visit: Payer: Self-pay

## 2021-08-12 DIAGNOSIS — M25562 Pain in left knee: Secondary | ICD-10-CM | POA: Diagnosis not present

## 2021-08-12 DIAGNOSIS — R262 Difficulty in walking, not elsewhere classified: Secondary | ICD-10-CM | POA: Diagnosis not present

## 2021-08-12 MED ORDER — CHOLESTYRAMINE 4 G PO PACK
PACK | ORAL | 3 refills | Status: DC
  Filled 2021-08-12: qty 60, 30d supply, fill #0

## 2021-08-12 MED ORDER — DIPHENOXYLATE-ATROPINE 2.5-0.025 MG PO TABS
ORAL_TABLET | ORAL | 2 refills | Status: DC
  Filled 2021-08-12: qty 60, 7d supply, fill #0
  Filled 2021-09-11: qty 60, 7d supply, fill #1
  Filled 2021-10-16: qty 60, 7d supply, fill #2

## 2021-08-13 DIAGNOSIS — F4321 Adjustment disorder with depressed mood: Secondary | ICD-10-CM | POA: Diagnosis not present

## 2021-08-15 DIAGNOSIS — R262 Difficulty in walking, not elsewhere classified: Secondary | ICD-10-CM | POA: Diagnosis not present

## 2021-08-15 DIAGNOSIS — M25562 Pain in left knee: Secondary | ICD-10-CM | POA: Diagnosis not present

## 2021-08-18 DIAGNOSIS — H40003 Preglaucoma, unspecified, bilateral: Secondary | ICD-10-CM | POA: Diagnosis not present

## 2021-08-19 DIAGNOSIS — R262 Difficulty in walking, not elsewhere classified: Secondary | ICD-10-CM | POA: Diagnosis not present

## 2021-08-19 DIAGNOSIS — M25562 Pain in left knee: Secondary | ICD-10-CM | POA: Diagnosis not present

## 2021-08-22 ENCOUNTER — Other Ambulatory Visit: Payer: Self-pay | Admitting: Urology

## 2021-08-22 DIAGNOSIS — R262 Difficulty in walking, not elsewhere classified: Secondary | ICD-10-CM | POA: Diagnosis not present

## 2021-08-22 DIAGNOSIS — M25562 Pain in left knee: Secondary | ICD-10-CM | POA: Diagnosis not present

## 2021-08-22 MED ORDER — TADALAFIL 20 MG PO TABS: ORAL_TABLET | ORAL | 3 refills | Status: DC

## 2021-09-01 ENCOUNTER — Other Ambulatory Visit: Payer: Self-pay

## 2021-09-05 DIAGNOSIS — G4733 Obstructive sleep apnea (adult) (pediatric): Secondary | ICD-10-CM | POA: Diagnosis not present

## 2021-09-10 ENCOUNTER — Other Ambulatory Visit: Payer: Self-pay

## 2021-09-12 ENCOUNTER — Other Ambulatory Visit: Payer: Self-pay

## 2021-10-01 ENCOUNTER — Other Ambulatory Visit: Payer: Self-pay

## 2021-10-02 DIAGNOSIS — G4733 Obstructive sleep apnea (adult) (pediatric): Secondary | ICD-10-CM | POA: Diagnosis not present

## 2021-10-06 ENCOUNTER — Other Ambulatory Visit: Payer: Self-pay

## 2021-10-10 DIAGNOSIS — F4321 Adjustment disorder with depressed mood: Secondary | ICD-10-CM | POA: Diagnosis not present

## 2021-10-16 ENCOUNTER — Other Ambulatory Visit: Payer: Self-pay

## 2021-10-17 ENCOUNTER — Other Ambulatory Visit: Payer: Self-pay

## 2021-10-17 MED ORDER — TELMISARTAN 80 MG PO TABS
ORAL_TABLET | ORAL | 4 refills | Status: AC
  Filled 2021-10-17: qty 90, 90d supply, fill #0
  Filled 2022-01-16: qty 90, 90d supply, fill #1
  Filled 2022-05-11 – 2022-05-22 (×2): qty 90, 90d supply, fill #2
  Filled 2022-08-15 – 2022-08-20 (×2): qty 90, 90d supply, fill #3

## 2021-11-07 DIAGNOSIS — Z96659 Presence of unspecified artificial knee joint: Secondary | ICD-10-CM | POA: Diagnosis not present

## 2021-11-18 ENCOUNTER — Other Ambulatory Visit: Payer: Self-pay

## 2021-11-18 MED ORDER — ALPRAZOLAM 1 MG PO TABS
ORAL_TABLET | Freq: Two times a day (BID) | ORAL | 3 refills | Status: DC
  Filled 2021-11-18: qty 60, 30d supply, fill #0
  Filled 2021-12-20: qty 60, 30d supply, fill #1
  Filled 2022-01-28: qty 60, 30d supply, fill #2
  Filled 2022-03-02: qty 60, 30d supply, fill #3

## 2021-11-21 ENCOUNTER — Other Ambulatory Visit: Payer: Self-pay

## 2021-11-21 MED ORDER — OXYCODONE HCL 10 MG PO TABS
ORAL_TABLET | ORAL | 0 refills | Status: DC
  Filled 2021-11-21: qty 30, 7d supply, fill #0

## 2021-11-26 ENCOUNTER — Other Ambulatory Visit: Payer: Self-pay

## 2021-11-26 MED ORDER — COVID-19 AT HOME ANTIGEN TEST VI KIT
PACK | 0 refills | Status: DC
  Filled 2021-11-26: qty 2, 4d supply, fill #0

## 2021-11-28 ENCOUNTER — Other Ambulatory Visit: Payer: Self-pay

## 2021-12-10 ENCOUNTER — Other Ambulatory Visit: Payer: Self-pay

## 2021-12-10 MED ORDER — AMLODIPINE BESYLATE 5 MG PO TABS
ORAL_TABLET | ORAL | 3 refills | Status: AC
  Filled 2021-12-10: qty 90, 90d supply, fill #0
  Filled 2022-03-11: qty 90, 90d supply, fill #1
  Filled 2022-06-13: qty 90, 90d supply, fill #2

## 2021-12-10 MED ORDER — DIPHENOXYLATE-ATROPINE 2.5-0.025 MG PO TABS
ORAL_TABLET | ORAL | 2 refills | Status: DC
  Filled 2021-12-10: qty 60, 8d supply, fill #0

## 2021-12-10 MED ORDER — OXYCODONE HCL 10 MG PO TABS
ORAL_TABLET | ORAL | 0 refills | Status: DC
  Filled 2021-12-10: qty 30, 8d supply, fill #0

## 2021-12-10 MED ORDER — CYCLOBENZAPRINE HCL 10 MG PO TABS
ORAL_TABLET | ORAL | 0 refills | Status: DC
  Filled 2021-12-10: qty 30, 10d supply, fill #0

## 2021-12-17 ENCOUNTER — Other Ambulatory Visit: Payer: Self-pay

## 2021-12-21 ENCOUNTER — Other Ambulatory Visit: Payer: Self-pay

## 2021-12-22 ENCOUNTER — Other Ambulatory Visit: Payer: Self-pay

## 2021-12-26 ENCOUNTER — Other Ambulatory Visit: Payer: Self-pay

## 2021-12-26 MED ORDER — OXYCODONE HCL 10 MG PO TABS
ORAL_TABLET | ORAL | 0 refills | Status: DC
  Filled 2021-12-26: qty 30, 7d supply, fill #0

## 2022-01-02 DIAGNOSIS — Z0000 Encounter for general adult medical examination without abnormal findings: Secondary | ICD-10-CM | POA: Diagnosis not present

## 2022-01-07 ENCOUNTER — Other Ambulatory Visit: Payer: Self-pay

## 2022-01-07 MED ORDER — OXYCODONE HCL 10 MG PO TABS
ORAL_TABLET | ORAL | 0 refills | Status: DC
  Filled 2022-01-07: qty 30, 8d supply, fill #0

## 2022-01-09 ENCOUNTER — Other Ambulatory Visit: Payer: Self-pay

## 2022-01-09 MED ORDER — METHYLPREDNISOLONE 4 MG PO TABS
ORAL_TABLET | ORAL | 0 refills | Status: DC
  Filled 2022-01-09: qty 36, 8d supply, fill #0

## 2022-01-19 ENCOUNTER — Other Ambulatory Visit: Payer: Self-pay

## 2022-01-21 ENCOUNTER — Other Ambulatory Visit: Payer: Self-pay

## 2022-01-21 MED ORDER — OXYCODONE HCL 10 MG PO TABS
ORAL_TABLET | ORAL | 0 refills | Status: DC
  Filled 2022-01-21: qty 30, 7d supply, fill #0

## 2022-01-22 ENCOUNTER — Other Ambulatory Visit: Payer: Self-pay

## 2022-01-22 MED ORDER — ONDANSETRON 8 MG PO TBDP
ORAL_TABLET | ORAL | 0 refills | Status: DC
  Filled 2022-01-22: qty 20, 10d supply, fill #0

## 2022-01-23 DIAGNOSIS — F4321 Adjustment disorder with depressed mood: Secondary | ICD-10-CM | POA: Diagnosis not present

## 2022-01-29 ENCOUNTER — Other Ambulatory Visit: Payer: Self-pay

## 2022-02-02 ENCOUNTER — Other Ambulatory Visit: Payer: Self-pay

## 2022-02-02 MED ORDER — OXYCODONE HCL 10 MG PO TABS
ORAL_TABLET | ORAL | 0 refills | Status: DC
  Filled 2022-02-02: qty 30, 7d supply, fill #0

## 2022-02-06 DIAGNOSIS — M545 Low back pain, unspecified: Secondary | ICD-10-CM | POA: Diagnosis not present

## 2022-02-09 ENCOUNTER — Ambulatory Visit (INDEPENDENT_AMBULATORY_CARE_PROVIDER_SITE_OTHER): Payer: 59 | Admitting: Dermatology

## 2022-02-09 DIAGNOSIS — L578 Other skin changes due to chronic exposure to nonionizing radiation: Secondary | ICD-10-CM

## 2022-02-09 DIAGNOSIS — D1800 Hemangioma unspecified site: Secondary | ICD-10-CM

## 2022-02-09 DIAGNOSIS — Z1283 Encounter for screening for malignant neoplasm of skin: Secondary | ICD-10-CM

## 2022-02-09 DIAGNOSIS — L814 Other melanin hyperpigmentation: Secondary | ICD-10-CM

## 2022-02-09 DIAGNOSIS — L821 Other seborrheic keratosis: Secondary | ICD-10-CM | POA: Diagnosis not present

## 2022-02-09 DIAGNOSIS — D2262 Melanocytic nevi of left upper limb, including shoulder: Secondary | ICD-10-CM | POA: Diagnosis not present

## 2022-02-09 DIAGNOSIS — L719 Rosacea, unspecified: Secondary | ICD-10-CM

## 2022-02-09 DIAGNOSIS — Q170 Accessory auricle: Secondary | ICD-10-CM | POA: Diagnosis not present

## 2022-02-09 DIAGNOSIS — L720 Epidermal cyst: Secondary | ICD-10-CM | POA: Diagnosis not present

## 2022-02-09 DIAGNOSIS — D229 Melanocytic nevi, unspecified: Secondary | ICD-10-CM | POA: Diagnosis not present

## 2022-02-09 NOTE — Patient Instructions (Addendum)
Seborrheic Keratosis  What causes seborrheic keratoses? Seborrheic keratoses are harmless, common skin growths that first appear during adult life.  As time goes by, more growths appear.  Some people may develop a large number of them.  Seborrheic keratoses appear on both covered and uncovered body parts.  They are not caused by sunlight.  The tendency to develop seborrheic keratoses can be inherited.  They vary in color from skin-colored to gray, brown, or even black.  They can be either smooth or have a rough, warty surface.   Seborrheic keratoses are superficial and look as if they were stuck on the skin.  Under the microscope this type of keratosis looks like layers upon layers of skin.  That is why at times the top layer may seem to fall off, but the rest of the growth remains and re-grows.    Treatment Seborrheic keratoses do not need to be treated, but can easily be removed in the office.  Seborrheic keratoses often cause symptoms when they rub on clothing or jewelry.  Lesions can be in the way of shaving.  If they become inflamed, they can cause itching, soreness, or burning.  Removal of a seborrheic keratosis can be accomplished by freezing, burning, or surgery. If any spot bleeds, scabs, or grows rapidly, please return to have it checked, as these can be an indication of a skin cancer.    Melanoma ABCDEs  Melanoma is the most dangerous type of skin cancer, and is the leading cause of death from skin disease.  You are more likely to develop melanoma if you: Have light-colored skin, light-colored eyes, or red or blond hair Spend a lot of time in the sun Tan regularly, either outdoors or in a tanning bed Have had blistering sunburns, especially during childhood Have a close family member who has had a melanoma Have atypical moles or large birthmarks  Early detection of melanoma is key since treatment is typically straightforward and cure rates are extremely high if we catch it early.    The first sign of melanoma is often a change in a mole or a new dark spot.  The ABCDE system is a way of remembering the signs of melanoma.  A for asymmetry:  The two halves do not match. B for border:  The edges of the growth are irregular. C for color:  A mixture of colors are present instead of an even brown color. D for diameter:  Melanomas are usually (but not always) greater than 6mm - the size of a pencil eraser. E for evolution:  The spot keeps changing in size, shape, and color.  Please check your skin once per month between visits. You can use a small mirror in front and a large mirror behind you to keep an eye on the back side or your body.   If you see any new or changing lesions before your next follow-up, please call to schedule a visit.  Please continue daily skin protection including broad spectrum sunscreen SPF 30+ to sun-exposed areas, reapplying every 2 hours as needed when you're outdoors.   Staying in the shade or wearing long sleeves, sun glasses (UVA+UVB protection) and wide brim hats (4-inch brim around the entire circumference of the hat) are also recommended for sun protection.    Due to recent changes in healthcare laws, you may see results of your pathology and/or laboratory studies on MyChart before the doctors have had a chance to review them. We understand that in some cases there   may be results that are confusing or concerning to you. Please understand that not all results are received at the same time and often the doctors may need to interpret multiple results in order to provide you with the best plan of care or course of treatment. Therefore, we ask that you please give us 2 business days to thoroughly review all your results before contacting the office for clarification. Should we see a critical lab result, you will be contacted sooner.   If You Need Anything After Your Visit  If you have any questions or concerns for your doctor, please call our main  line at [phone] and press option 4 to reach your doctor's medical assistant. If no one answers, please leave a voicemail as directed and we will return your call as soon as possible. Messages left after 4 pm will be answered the following business day.   You may also send us a message via MyChart. We typically respond to MyChart messages within 1-2 business days.  For prescription refills, please ask your pharmacy to contact our office. Our fax number is [phone].  If you have an urgent issue when the clinic is closed that cannot wait until the next business day, you can page your doctor at the number below.    Please note that while we do our best to be available for urgent issues outside of office hours, we are not available 24/7.   If you have an urgent issue and are unable to reach us, you may choose to seek medical care at your doctor's office, retail clinic, urgent care center, or emergency room.  If you have a medical emergency, please immediately call 911 or go to the emergency department.  Pager Numbers  - Dr. Kowalski: [phone]  - Dr. Moye: [phone]  - Dr. Stewart: [phone]  In the event of inclement weather, please call our main line at [phone] for an update on the status of any delays or closures.  Dermatology Medication Tips: Please keep the boxes that topical medications come in in order to help keep track of the instructions about where and how to use these. Pharmacies typically print the medication instructions only on the boxes and not directly on the medication tubes.   If your medication is too expensive, please contact our office at [phone] option 4 or send us a message through MyChart.   We are unable to tell what your co-pay for medications will be in advance as this is different depending on your insurance coverage. However, we may be able to find a substitute medication at lower cost or fill out paperwork to get insurance to cover a  needed medication.   If a prior authorization is required to get your medication covered by your insurance company, please allow us 1-2 business days to complete this process.  Drug prices often vary depending on where the prescription is filled and some pharmacies may offer cheaper prices.  The website www.goodrx.com contains coupons for medications through different pharmacies. The prices here do not account for what the cost may be with help from insurance (it may be cheaper with your insurance), but the website can give you the price if you did not use any insurance.  - You can print the associated coupon and take it with your prescription to the pharmacy.  - You may also stop by our office during regular business hours and pick up a GoodRx coupon card.  - If you need your prescription sent electronically to   a different pharmacy, notify our office through [place] MyChart or by phone at [phone] option 4.     Si Usted Necesita Algo Despus de Su Visita  Tambin puede enviarnos un mensaje a travs de MyChart. Por lo general respondemos a los mensajes de MyChart en el transcurso de 1 a 2 das hbiles.  Para renovar recetas, por favor pida a su farmacia que se ponga en contacto con nuestra oficina. Nuestro nmero de fax es el [phone].  Si tiene un asunto urgente cuando la clnica est cerrada y que no puede esperar hasta el siguiente da hbil, puede llamar/localizar a su doctor(a) al nmero que aparece a continuacin.   Por favor, tenga en cuenta que aunque hacemos todo lo posible para estar disponibles para asuntos urgentes fuera del horario de oficina, no estamos disponibles las 24 horas del da, los 7 das de la semana.   Si tiene un problema urgente y no puede comunicarse con nosotros, puede optar por buscar atencin mdica  en el consultorio de su doctor(a), en una clnica privada, en un centro de atencin urgente o en una sala de emergencias.  Si tiene una emergencia  mdica, por favor llame inmediatamente al 911 o vaya a la sala de emergencias.  Nmeros de bper  - Dr. Kowalski: [phone]  - Dra. Moye: [phone]  - Dra. Stewart: [phone]  En caso de inclemencias del tiempo, por favor llame a nuestra lnea principal al [phone] para una actualizacin sobre el estado de cualquier retraso o cierre.  Consejos para la medicacin en dermatologa: Por favor, guarde las cajas en las que vienen los medicamentos de uso tpico para ayudarle a seguir las instrucciones sobre dnde y cmo usarlos. Las farmacias generalmente imprimen las instrucciones del medicamento slo en las cajas y no directamente en los tubos del medicamento.   Si su medicamento es muy caro, por favor, pngase en contacto con nuestra oficina llamando al [phone] y presione la opcin 4 o envenos un mensaje a travs de MyChart.   No podemos decirle cul ser su copago por los medicamentos por adelantado ya que esto es diferente dependiendo de la cobertura de su seguro. Sin embargo, es posible que podamos encontrar un medicamento sustituto a menor costo o llenar un formulario para que el seguro cubra el medicamento que se considera necesario.   Si se requiere una autorizacin previa para que su compaa de seguros cubra su medicamento, por favor permtanos de 1 a 2 das hbiles para completar este proceso.  Los precios de los medicamentos varan con frecuencia dependiendo del lugar de dnde se surte la receta y alguna farmacias pueden ofrecer precios ms baratos.  El sitio web www.goodrx.com tiene cupones para medicamentos de diferentes farmacias. Los precios aqu no tienen en cuenta lo que podra costar con la ayuda del seguro (puede ser ms barato con su seguro), pero el sitio web puede darle el precio si no utiliz ningn seguro.  - Puede imprimir el cupn correspondiente y llevarlo con su receta a la farmacia.  - Tambin puede pasar por nuestra oficina durante el horario de  atencin regular y recoger una tarjeta de cupones de GoodRx.  - Si necesita que su receta se enve electrnicamente a una farmacia diferente, informe a nuestra oficina a travs de MyChart de [place] o por telfono llamando al [phone] y presione la opcin 4.  

## 2022-02-09 NOTE — Progress Notes (Signed)
New Patient Visit  Subjective  Zachary Chung is a 61 y.o. male who presents for the following: New Patient (Initial Visit) (Patient here today for tbse. Reports no personal history or family history of skin cancer. Has been spending more time in sun and would just like to get checked. ). The patient presents for Total-Body Skin Exam (TBSE) for skin cancer screening and mole check.  The patient has spots, moles and lesions to be evaluated, some may be new or changing and the patient has concerns that these could be cancer.  The following portions of the chart were reviewed this encounter and updated as appropriate:   Tobacco  Allergies  Meds  Problems  Med Hx  Surg Hx  Fam Hx     Review of Systems:  No other skin or systemic complaints except as noted in HPI or Assessment and Plan.  Objective  Well appearing patient in no apparent distress; mood and affect are within normal limits.  A full examination was performed including scalp, head, eyes, ears, nose, lips, neck, chest, axillae, abdomen, back, buttocks, bilateral upper extremities, bilateral lower extremities, hands, feet, fingers, toes, fingernails, and toenails. All findings within normal limits unless otherwise noted below.  face Mid face erythema with telangiectasias +/- scattered inflammatory papules.   left forearm 0.5 cm regular brown macule    Assessment & Plan  Rosacea face Rosacea is a chronic progressive skin condition usually affecting the face of adults, causing redness and/or acne bumps. It is treatable but not curable. It sometimes affects the eyes (ocular rosacea) as well. It may respond to topical and/or systemic medication and can flare with stress, sun exposure, alcohol, exercise and some foods.  Daily application of broad spectrum spf 30+ sunscreen to face is recommended to reduce flares Mild  Do not recommend any treatment at this time.   Nevus left forearm Benign-appearing.  Observation.  Call  clinic for new or changing lesions.  Recommend daily use of broad spectrum spf 30+ sunscreen to sun-exposed areas.   Accessory tragus of ear Left Ear Benign-appearing.  Observation.  Call clinic for new or changing lesions.  Recommend daily use of broad spectrum spf 30+ sunscreen to sun-exposed areas.   Epidermoid cyst of skin Right Upper Eyelid Margin Benign-appearing. Exam most consistent with an epidermal inclusion cyst. Discussed that a cyst is a benign growth that can grow over time and sometimes get irritated or inflamed. Recommend observation if it is not bothersome. Discussed option of surgical excision to remove it if it is growing, symptomatic, or other changes noted. Please call for new or changing lesions so they can be evaluated. Patient declines removal today.  Lentigines - Scattered tan macules - Due to sun exposure - Benign-appearing, observe - Recommend daily broad spectrum sunscreen SPF 30+ to sun-exposed areas, reapply every 2 hours as needed. - Call for any changes  Dermatofibroma - Firm pink/brown papulenodule with dimple sign - Benign appearing - Call for any changes Left medial lower leg above ankle   Seborrheic Keratoses - Stuck-on, waxy, tan-brown papules and/or plaques  - Benign-appearing - Discussed benign etiology and prognosis. - Observe - Call for any changes  Melanocytic Nevi - Tan-brown and/or pink-flesh-colored symmetric macules and papules - Benign appearing on exam today - Observation - Call clinic for new or changing moles - Recommend daily use of broad spectrum spf 30+ sunscreen to sun-exposed areas.   Hemangiomas - Red papules - Discussed benign nature - Observe - Call for any  changes  Actinic Damage - Chronic condition, secondary to cumulative UV/sun exposure - diffuse scaly erythematous macules with underlying dyspigmentation - Recommend daily broad spectrum sunscreen SPF 30+ to sun-exposed areas, reapply every 2 hours as needed.   - Staying in the shade or wearing long sleeves, sun glasses (UVA+UVB protection) and wide brim hats (4-inch brim around the entire circumference of the hat) are also recommended for sun protection.  - Call for new or changing lesions.  Skin cancer screening performed today. Return if symptoms worsen or fail to improve.  IRuthell Rummage, CMA, am acting as scribe for Sarina Ser, MD. Documentation: I have reviewed the above documentation for accuracy and completeness, and I agree with the above.  Sarina Ser, MD

## 2022-02-13 ENCOUNTER — Other Ambulatory Visit: Payer: Self-pay

## 2022-02-13 MED ORDER — OXYCODONE HCL 10 MG PO TABS
ORAL_TABLET | ORAL | 0 refills | Status: DC
  Filled 2022-02-13: qty 30, 7d supply, fill #0

## 2022-02-14 ENCOUNTER — Encounter: Payer: Self-pay | Admitting: Dermatology

## 2022-03-03 ENCOUNTER — Other Ambulatory Visit: Payer: Self-pay

## 2022-03-05 DIAGNOSIS — G4733 Obstructive sleep apnea (adult) (pediatric): Secondary | ICD-10-CM | POA: Diagnosis not present

## 2022-03-11 ENCOUNTER — Other Ambulatory Visit: Payer: Self-pay

## 2022-04-02 ENCOUNTER — Other Ambulatory Visit: Payer: Self-pay

## 2022-04-02 MED ORDER — ALPRAZOLAM 1 MG PO TABS: ORAL_TABLET | Freq: Two times a day (BID) | ORAL | 3 refills | Status: DC

## 2022-04-03 ENCOUNTER — Other Ambulatory Visit: Payer: Self-pay

## 2022-04-03 MED ORDER — PANTOPRAZOLE SODIUM 40 MG PO TBEC
40.0000 mg | DELAYED_RELEASE_TABLET | Freq: Every day | ORAL | 3 refills | Status: AC
  Filled 2022-04-03: qty 90, 90d supply, fill #0

## 2022-04-22 DIAGNOSIS — F4321 Adjustment disorder with depressed mood: Secondary | ICD-10-CM | POA: Diagnosis not present

## 2022-05-11 ENCOUNTER — Telehealth: Payer: Self-pay

## 2022-05-11 ENCOUNTER — Other Ambulatory Visit: Payer: Self-pay

## 2022-05-11 NOTE — Telephone Encounter (Signed)
Patient requesting a copy of his immunizations from office, [facility].  Also asked for copy of his FIT testing.

## 2022-05-18 ENCOUNTER — Encounter (INDEPENDENT_AMBULATORY_CARE_PROVIDER_SITE_OTHER): Payer: Self-pay

## 2022-05-22 ENCOUNTER — Other Ambulatory Visit: Payer: Self-pay

## 2022-06-01 ENCOUNTER — Other Ambulatory Visit: Payer: Self-pay

## 2022-06-01 DIAGNOSIS — Z86010 Personal history of colonic polyps: Secondary | ICD-10-CM

## 2022-06-15 ENCOUNTER — Other Ambulatory Visit: Payer: Self-pay

## 2022-06-16 ENCOUNTER — Other Ambulatory Visit: Payer: Self-pay

## 2022-06-17 ENCOUNTER — Other Ambulatory Visit: Payer: Self-pay

## 2022-06-17 MED ORDER — CLENPIQ 10-3.5-12 MG-GM -GM/160ML PO SOLN
320.0000 mL | ORAL | 0 refills | Status: DC
  Filled 2022-06-17 – 2022-06-19 (×2): qty 320, 1d supply, fill #0

## 2022-06-19 ENCOUNTER — Other Ambulatory Visit: Payer: Self-pay

## 2022-06-22 ENCOUNTER — Other Ambulatory Visit: Payer: Self-pay

## 2022-06-22 ENCOUNTER — Encounter: Payer: Self-pay | Admitting: Gastroenterology

## 2022-06-23 ENCOUNTER — Other Ambulatory Visit: Payer: Self-pay

## 2022-06-23 MED ORDER — NA SULFATE-K SULFATE-MG SULF 17.5-3.13-1.6 GM/177ML PO SOLN
1.0000 | Freq: Once | ORAL | 0 refills | Status: AC
  Filled 2022-06-23: qty 354, 1d supply, fill #0

## 2022-06-23 NOTE — Addendum Note (Signed)
Addended by: Lurlean Nanny on: 06/23/2022 02:33 PM   Modules accepted: Orders

## 2022-06-26 ENCOUNTER — Other Ambulatory Visit: Payer: Self-pay

## 2022-06-26 ENCOUNTER — Ambulatory Visit: Payer: 59 | Admitting: Anesthesiology

## 2022-06-26 ENCOUNTER — Encounter
Admission: RE | Disposition: A | Discharge status: Home / Self Care | Payer: Self-pay | Source: Ambulatory Visit | Attending: Gastroenterology

## 2022-06-26 ENCOUNTER — Encounter: Payer: Self-pay | Admitting: Gastroenterology

## 2022-06-26 ENCOUNTER — Ambulatory Visit
Admission: RE | Admit: 2022-06-26 | Discharge: 2022-06-26 | Disposition: A | Discharge status: Home / Self Care | Payer: 59 | Source: Ambulatory Visit | Attending: Gastroenterology | Admitting: Gastroenterology

## 2022-06-26 DIAGNOSIS — I10 Essential (primary) hypertension: Secondary | ICD-10-CM | POA: Diagnosis not present

## 2022-06-26 DIAGNOSIS — Z86010 Personal history of colonic polyps: Secondary | ICD-10-CM | POA: Insufficient documentation

## 2022-06-26 DIAGNOSIS — F17200 Nicotine dependence, unspecified, uncomplicated: Secondary | ICD-10-CM | POA: Diagnosis not present

## 2022-06-26 DIAGNOSIS — Z86718 Personal history of other venous thrombosis and embolism: Secondary | ICD-10-CM | POA: Diagnosis not present

## 2022-06-26 DIAGNOSIS — N529 Male erectile dysfunction, unspecified: Secondary | ICD-10-CM | POA: Insufficient documentation

## 2022-06-26 DIAGNOSIS — K219 Gastro-esophageal reflux disease without esophagitis: Secondary | ICD-10-CM | POA: Insufficient documentation

## 2022-06-26 DIAGNOSIS — G4730 Sleep apnea, unspecified: Secondary | ICD-10-CM | POA: Diagnosis not present

## 2022-06-26 DIAGNOSIS — K640 First degree hemorrhoids: Secondary | ICD-10-CM | POA: Insufficient documentation

## 2022-06-26 DIAGNOSIS — F419 Anxiety disorder, unspecified: Secondary | ICD-10-CM | POA: Insufficient documentation

## 2022-06-26 DIAGNOSIS — F32A Depression, unspecified: Secondary | ICD-10-CM | POA: Insufficient documentation

## 2022-06-26 DIAGNOSIS — K621 Rectal polyp: Secondary | ICD-10-CM | POA: Diagnosis not present

## 2022-06-26 DIAGNOSIS — Z09 Encounter for follow-up examination after completed treatment for conditions other than malignant neoplasm: Secondary | ICD-10-CM | POA: Insufficient documentation

## 2022-06-26 HISTORY — PX: COLONOSCOPY WITH PROPOFOL: SHX5780

## 2022-06-26 SURGERY — COLONOSCOPY WITH PROPOFOL
Anesthesia: General | Site: Rectum

## 2022-06-26 MED ORDER — STERILE WATER FOR IRRIGATION IR SOLN
Status: DC | PRN
  Administered 2022-06-26: 150 mL

## 2022-06-26 MED ORDER — LACTATED RINGERS IV SOLN: INTRAVENOUS | Status: DC

## 2022-06-26 MED ORDER — SODIUM CHLORIDE 0.9 % IV SOLN: INTRAVENOUS | Status: DC

## 2022-06-26 MED ORDER — LIDOCAINE HCL (CARDIAC) PF 100 MG/5ML IV SOSY
PREFILLED_SYRINGE | INTRAVENOUS | Status: DC | PRN
  Administered 2022-06-26: 80 mg via INTRAVENOUS

## 2022-06-26 MED ORDER — PROPOFOL 10 MG/ML IV BOLUS
INTRAVENOUS | Status: DC | PRN
  Administered 2022-06-26: 50 mg via INTRAVENOUS
  Administered 2022-06-26: 20 mg via INTRAVENOUS
  Administered 2022-06-26 (×3): 30 mg via INTRAVENOUS
  Administered 2022-06-26: 20 mg via INTRAVENOUS
  Administered 2022-06-26: 100 mg via INTRAVENOUS
  Administered 2022-06-26: 40 mg via INTRAVENOUS
  Administered 2022-06-26: 20 mg via INTRAVENOUS
  Administered 2022-06-26: 30 mg via INTRAVENOUS
  Administered 2022-06-26: 50 mg via INTRAVENOUS

## 2022-06-26 SURGICAL SUPPLY — 21 items

## 2022-06-26 NOTE — Transfer of Care (Signed)
Immediate Anesthesia Transfer of Care Note  Patient: Zachary Chung  Procedure(s) Performed: COLONOSCOPY WITH PROPOFOL (Rectum)  Patient Location: PACU  Anesthesia Type: General  Level of Consciousness: awake, alert  and patient cooperative  Airway and Oxygen Therapy: Patient Spontanous Breathing and Patient connected to supplemental oxygen  Post-op Assessment: Post-op Vital signs reviewed, Patient's Cardiovascular Status Stable, Respiratory Function Stable, Patent Airway and No signs of Nausea or vomiting  Post-op Vital Signs: Reviewed and stable  Complications: No notable events documented.

## 2022-06-26 NOTE — Op Note (Signed)
[facility] Gastroenterology Patient Name: Quanell Loughney Procedure Date: 06/26/2022 7:22 AM MRN: [id number] Account #: [id number] Date of Birth: [birth year] Admit Type: Outpatient Age: 61 Room: [facility] OR ROOM 01 Gender: Male Note Status: Finalized Instrument Name: [id number] Procedure:             Colonoscopy Indications:           High risk colon cancer surveillance: Personal history                         of colonic polyps Providers:             Lucilla Lame MD, MD Referring MD:          Leona Carry. Hall Busing, MD (Referring MD) Medicines:             Propofol per Anesthesia Complications:         No immediate complications. Procedure:             Pre-Anesthesia Assessment:                        - Prior to the procedure, a History and Physical was                         performed, and patient medications and allergies were                         reviewed. The patient's tolerance of previous                         anesthesia was also reviewed. The risks and benefits                         of the procedure and the sedation options and risks                         were discussed with the patient. All questions were                         answered, and informed consent was obtained. Prior                         Anticoagulants: The patient has taken no anticoagulant                         or antiplatelet agents. ASA Grade Assessment: II - A                         patient with mild systemic disease. After reviewing                         the risks and benefits, the patient was deemed in                         satisfactory condition to undergo the procedure.                        After obtaining informed consent, the colonoscope was  passed under direct vision. Throughout the procedure,                         the patient's blood pressure, pulse, and oxygen                         saturations were monitored continuously. The was                          introduced through the anus and advanced to the the                         cecum, identified by appendiceal orifice and ileocecal                         valve. The colonoscopy was performed without                         difficulty. The patient tolerated the procedure well.                         The quality of the bowel preparation was excellent. Findings:      The perianal and digital rectal examinations were normal.      Non-bleeding internal hemorrhoids were found during retroflexion. The       hemorrhoids were Grade I (internal hemorrhoids that do not prolapse).      Random biopsies were obtained with cold forceps for histology randomly       in the entire colon. Impression:            - Non-bleeding internal hemorrhoids.                        - Random biopsies were obtained in the entire colon. Recommendation:        - Discharge patient to home.                        - Resume previous diet.                        - Continue present medications.                        - Await pathology results.                        - Repeat colonoscopy in 5 years for surveillance. Procedure Code(s):     --- Professional ---                        [id number], Colonoscopy, flexible; with biopsy, single or                         multiple Diagnosis Code(s):     --- Professional ---                        Z86.010, Personal history of colonic polyps CPT copyright 2022 American Medical Association. All rights reserved. The codes documented in this report are preliminary and upon coder review may  be revised to meet current compliance requirements. Trystyn Sitts  Jacquez Sheetz MD, MD 06/26/2022 7:53:44 AM This report has been signed electronically. Number of Addenda: 0 Note Initiated On: 06/26/2022 7:22 AM Scope Withdrawal Time: 0 hours 12 minutes 48 seconds  Total Procedure Duration: 0 hours 19 minutes 35 seconds  Estimated Blood Loss:  Estimated blood loss: none.      [facility]

## 2022-06-26 NOTE — Anesthesia Preprocedure Evaluation (Signed)
Anesthesia Evaluation  Patient identified by MRN, date of birth, ID band Patient awake    Reviewed: Allergy & Precautions, NPO status , Patient's Chart, lab work & pertinent test results  History of Anesthesia Complications Negative for: history of anesthetic complications  Airway Mallampati: III  TM Distance: >3 FB Neck ROM: full    Dental  (+) Chipped, Poor Dentition   Pulmonary neg shortness of breath, sleep apnea , neg recent URI, Current Smoker   Pulmonary exam normal        Cardiovascular Exercise Tolerance: Good hypertension, (-) angina (-) Past MI and (-) DOE Normal cardiovascular exam     Neuro/Psych  PSYCHIATRIC DISORDERS      negative neurological ROS     GI/Hepatic Neg liver ROS,GERD  Medicated and Controlled,,  Endo/Other  negative endocrine ROS    Renal/GU      Musculoskeletal  (+) Arthritis ,    Abdominal   Peds  Hematology negative hematology ROS (+)   Anesthesia Other Findings Past Medical History: No date: Adenomatous polyp of ascending colon No date: Anxiety No date: Arthritis No date: Depression     Comment:  H/O NO MEDS No date: 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)     Comment:  left calf No date: ED (erectile dysfunction) No date: Elevated PSA No date: GERD (gastroesophageal reflux disease) No date: Hypertension 1994: Pneumonia No date: Sleep apnea     Comment:  wears BI-PAP No date: Wears glasses  Past Surgical History: 01/26/2019: BIOPSY     Comment:  Procedure: BIOPSY;  Surgeon: Laurence Spates, MD;                Location: [facility];  Service: Endoscopy;; No date: COLONOSCOPY 05/04/2017: COLONOSCOPY WITH PROPOFOL; N/A     Comment:  Procedure: COLONOSCOPY WITH PROPOFOL;  Surgeon: Laurence Spates, MD;  Location: [facility];  Service: Endoscopy;               Laterality: N/A; 01/26/2019: COLONOSCOPY WITH PROPOFOL; N/A     Comment:  Procedure: COLONOSCOPY  WITH PROPOFOL;  Surgeon: Laurence Spates, MD;  Location: [facility];  Service: Endoscopy;               Laterality: N/A; 05/04/2017: HOT HEMOSTASIS; N/A     Comment:  Procedure: HOT HEMOSTASIS (ARGON PLASMA               COAGULATION/BICAP);  Surgeon: Laurence Spates, MD;                Location: [facility];  Service: Endoscopy;  Laterality:              N/A; No date: KNEE ARTHROSCOPY W/ ACL RECONSTRUCTION     Comment:  left 12/12/2020: KNEE ARTHROSCOPY WITH MENISCAL REPAIR; Left     Comment:  Procedure: KNEE ARTHROSCOPY WITH MENISCAL REPAIR;                Surgeon: Thornton Park, MD;  Location: [facility];                Service: Orthopedics;  Laterality: Left; 01/26/2019: POLYPECTOMY     Comment:  Procedure: POLYPECTOMY;  Surgeon: Laurence Spates, MD;                Location: [facility];  Service: Endoscopy;; No date: SEPTOPLASTY 05/20/2017: SINUS SURGERY  WITH INSTATRAK     Comment:  @ [place] 06/03/2017: TRANSANAL EXCISION OF RECTAL MASS; N/A     Comment:  Procedure: TRANSANAL EXCISION OF RECTAL POLYP;  Surgeon:              [name], MD;  Location: [facility] ORS;  Service:               General;  Laterality: N/A; No date: WISDOM TOOTH EXTRACTION     Reproductive/Obstetrics negative OB ROS                             Anesthesia Physical Anesthesia Plan  ASA: 3  Anesthesia Plan: General   Post-op Pain Management:    Induction: Intravenous  PONV Risk Score and Plan: 2 and Propofol infusion and TIVA  Airway Management Planned: Natural Airway and Nasal Cannula  Additional Equipment:   Intra-op Plan:   Post-operative Plan:   Informed Consent: I have reviewed the patients History and Physical, chart, labs and discussed the procedure including the risks, benefits and alternatives for the proposed anesthesia with the patient or authorized representative who has indicated his/her understanding and acceptance.      Dental Advisory Given  Plan Discussed with: Anesthesiologist, CRNA and Surgeon  Anesthesia Plan Comments: (Patient reports no bleeding problems and no anticoagulant use.  Plan for spinal with backup GA  Patient consented for risks of anesthesia including but not limited to:  - adverse reactions to medications - damage to eyes, teeth, lips or other oral mucosa - nerve damage due to positioning  - risk of bleeding, infection and or nerve damage from spinal that could lead to paralysis - risk of headache or failed spinal - damage to teeth, lips or other oral mucosa - sore throat or hoarseness - damage to heart, brain, nerves, lungs, other parts of body or loss of life  Patient voiced understanding.)        Anesthesia Quick Evaluation

## 2022-06-26 NOTE — H&P (Signed)
Zachary Lame, MD [facility] [address], [place], [place] [address] Phone:[phone] Fax : [phone]  Primary Care Physician:  Albina Billet, MD Primary Gastroenterologist:  Dr. Allen Norris  Pre-Procedure History & Physical: HPI:  Zachary Chung is a 61 y.o. male is here for an colonoscopy.   Past Medical History:  Diagnosis Date   Adenomatous polyp of ascending colon    Anxiety    Arthritis    Depression    H/O NO MEDS   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)    left calf   ED (erectile dysfunction)    Elevated PSA    GERD (gastroesophageal reflux disease)    Hypertension    Pneumonia 1994   Sleep apnea    wears BI-PAP   Wears glasses     Past Surgical History:  Procedure Laterality Date   ARTERY REPAIR Left 06/19/2021   Procedure: STITCH AND REPAIR OF ARTERY;  Surgeon: Thornton Park, MD;  Location: [facility];  Service: Orthopedics;  Laterality: Left;  DR. Lucky Cowboy   BIOPSY  01/26/2019   Procedure: BIOPSY;  Surgeon: Laurence Spates, MD;  [facility];  Service: Endoscopy;;   COLONOSCOPY     COLONOSCOPY WITH PROPOFOL N/A 05/04/2017   Procedure: COLONOSCOPY WITH PROPOFOL;  Surgeon: Laurence Spates, MD;  [facility];  Service: Endoscopy;  Laterality: N/A;   COLONOSCOPY WITH PROPOFOL N/A 01/26/2019   Procedure: COLONOSCOPY WITH PROPOFOL;  Surgeon: Laurence Spates, MD;  [facility];  Service: Endoscopy;  Laterality: N/A;   HOT HEMOSTASIS N/A 05/04/2017   Procedure: HOT HEMOSTASIS (ARGON PLASMA COAGULATION/BICAP);  Surgeon: Laurence Spates, MD;  [facility];  Service: Endoscopy;  Laterality: N/A;   KNEE ARTHROSCOPY W/ ACL RECONSTRUCTION     left   KNEE ARTHROSCOPY WITH MENISCAL REPAIR Left 12/12/2020   Procedure: KNEE ARTHROSCOPY WITH MENISCAL REPAIR;  Surgeon: Thornton Park, MD;  Location: [facility];  Service: Orthopedics;  Laterality: Left;   POLYPECTOMY  01/26/2019   Procedure: POLYPECTOMY;  Surgeon: Laurence Spates, MD;  [facility];  Service: Endoscopy;;   SEPTOPLASTY     SINUS SURGERY WITH INSTATRAK  05/20/2017   @ [place] Left 06/19/2021   Procedure: LEFT TOTAL KNEE ARTHROPLASTY;  Surgeon: Thornton Park, MD;  Location: [facility];  Service: Orthopedics;  Laterality: Left;  DR. Mack Guise   TRANSANAL EXCISION OF RECTAL MASS N/A 06/03/2017   Procedure: TRANSANAL EXCISION OF RECTAL POLYP;  Surgeon: [name], MD;  Location: [facility];  Service: General;  Laterality: N/A;   WISDOM TOOTH EXTRACTION      Prior to Admission medications   Medication Sig Start Date End Date Taking? Authorizing Provider  ALPRAZolam Duanne Moron) 1 MG tablet 1 po bid 04/02/22  Yes   amLODipine (NORVASC) 5 MG tablet take 1 tablet (5 mg) by oral route once daily 12/10/21  Yes   diphenoxylate-atropine (LOMOTIL) 2.5-0.025 MG tablet take 1- 2 tablets (5 mg) by oral route 4 times per day as needed 12/10/21  Yes   naproxen (NAPROSYN) 500 MG tablet take 1 tablet (500 mg) by oral route 2 times per day with food 07/25/21  Yes   ondansetron (ZOFRAN) 4 MG tablet Take 1 tablet (4 mg total) by mouth every 8 (eight) hours as needed for nausea. 06/20/21  Yes Thornton Park, MD  pantoprazole (PROTONIX) 40 MG tablet Take 1 tablet (40 mg total) by mouth daily. 04/03/22  Yes   tadalafil (CIALIS) 20  MG tablet TAKE 1 TABLET BY MOUTH DAILY AS NEEDED FOR ED. TAKE 30-60 MINUTES PRIOR TO INTERCOURSE. 08/22/21  Yes Stoioff, Ronda Fairly, MD  telmisartan (MICARDIS) 80 MG tablet take 1 tablet (80 mg) by oral route once daily 10/17/21  Yes   cholestyramine (QUESTRAN) 4 g packet take 1 packet (4 gram) dissolved in 2 to 6 ounces of water by oral route 2 times per day before meals 08/12/21     COVID-19 At Home Antigen Test (CARESTART COVID-19 HOME TEST) KIT Use as directed Patient not taking: Reported on 02/09/2022 11/26/21   Letta Median, RPH  Na Sulfate-K Sulfate-Mg Sulf 17.5-3.13-1.6 GM/177ML SOLN Take 1 kit by mouth once for 1  dose. 06/23/22 06/26/22  Zachary Lame, MD  Oxycodone HCl 10 MG TABS 1 po q 6-8 hours prn pain 02/13/22     Sod Picosulfate-Mag Ox-Cit Acd (CLENPIQ) 10-3.5-12 MG-GM -GM/160ML SOLN Take 320 mLs by mouth as directed. 06/17/22   Zachary Lame, MD    Allergies as of 06/01/2022 - Review Complete 02/14/2022  Allergen Reaction Noted   Tetracyclines & related Rash 04/21/2017    Family History  Problem Relation Age of Onset   Parkinson's disease Mother    Colon polyps Mother    Heart disease Father    Colon cancer Maternal Uncle 54    Social History   Socioeconomic History   Marital status: Married    Spouse name: Not on file   Number of children: Not on file   Years of education: Not on file   Highest education level: Not on file  Occupational History   Not on file  Tobacco Use   Smoking status: Some Days    Types: Cigars   Smokeless tobacco: Never   Tobacco comments:    VERY SELDOM ONLY WHEN HE PLAYS GOLF   Vaping Use   Vaping Use: Never used  Substance and Sexual Activity   Alcohol use: Yes    Alcohol/week: 21.0 standard drinks of alcohol    Types: 21 Cans of beer per week   Drug use: No   Sexual activity: Yes    Birth control/protection: None  Other Topics Concern   Not on file  Social History Narrative   Not on file   Social Determinants of Health   Financial Resource Strain: Not on file  Food Insecurity: Not on file  Transportation Needs: Not on file  Physical Activity: Not on file  Stress: Not on file  Social Connections: Not on file  Intimate Partner Violence: Not on file    Review of Systems: See HPI, otherwise negative ROS  Physical Exam: BP (!) 130/94   Pulse 76   Temp 98.9 F (37.2 C) (Temporal)   Resp 20   Ht _0  (1.93 m)   Wt 113.9 kg   SpO2 95%   BMI 30.55 kg/m  General:   Alert,  pleasant and cooperative in NAD Head:  Normocephalic and atraumatic. Neck:  Supple; no masses or thyromegaly. Lungs:  Clear throughout to auscultation.     Heart:  Regular rate and rhythm. Abdomen:  Soft, nontender and nondistended. Normal bowel sounds, without guarding, and without rebound.   Neurologic:  Alert and  oriented x4;  grossly normal neurologically.  Impression/Plan: Zachary Chung is here for an colonoscopy to be performed for a history of adenomatous polyps on 2020   Risks, benefits, limitations, and alternatives regarding  colonoscopy have been reviewed with the patient.  Questions have been answered.  All  parties agreeable.   Zachary Lame, MD  06/26/2022, 7:16 AM

## 2022-06-26 NOTE — Anesthesia Postprocedure Evaluation (Signed)
Anesthesia Post Note  Patient: Zachary Chung  Procedure(s) Performed: COLONOSCOPY WITH PROPOFOL (Rectum)  Patient location during evaluation: PACU Anesthesia Type: General Level of consciousness: awake and alert Pain management: pain level controlled Vital Signs Assessment: post-procedure vital signs reviewed and stable Respiratory status: spontaneous breathing, nonlabored ventilation, respiratory function stable and patient connected to nasal cannula oxygen Cardiovascular status: blood pressure returned to baseline and stable Postop Assessment: no apparent nausea or vomiting Anesthetic complications: no   No notable events documented.   Last Vitals:  Vitals:   06/26/22 0754 06/26/22 0804  BP: 122/79 130/82  Pulse: 71 70  Resp: 20 15  Temp: 36.6 C (!) 36.4 C  SpO2: 94% 97%    Last Pain:  Vitals:   06/26/22 0804  TempSrc:   PainSc: 0-No pain                 Martha Clan

## 2022-06-28 ENCOUNTER — Other Ambulatory Visit: Payer: Self-pay

## 2022-06-28 ENCOUNTER — Other Ambulatory Visit: Payer: Self-pay | Admitting: Gastroenterology

## 2022-06-28 MED ORDER — VIBERZI 100 MG PO TABS
100.0000 mg | ORAL_TABLET | Freq: Two times a day (BID) | ORAL | 5 refills | Status: DC
  Filled 2022-06-28: qty 60, 30d supply, fill #0

## 2022-06-29 ENCOUNTER — Other Ambulatory Visit: Payer: Self-pay

## 2022-06-29 ENCOUNTER — Encounter: Payer: Self-pay | Admitting: Gastroenterology

## 2022-06-30 ENCOUNTER — Encounter: Payer: Self-pay | Admitting: Gastroenterology

## 2022-06-30 LAB — SURGICAL PATHOLOGY

## 2022-07-29 ENCOUNTER — Ambulatory Visit (INDEPENDENT_AMBULATORY_CARE_PROVIDER_SITE_OTHER): Payer: Commercial Managed Care - PPO | Admitting: Urology

## 2022-07-29 ENCOUNTER — Encounter: Payer: Self-pay | Admitting: Urology

## 2022-07-29 VITALS — BP 177/98 | HR 76 | Ht 76.0 in | Wt 260.0 lb

## 2022-07-29 DIAGNOSIS — R972 Elevated prostate specific antigen [PSA]: Secondary | ICD-10-CM | POA: Diagnosis not present

## 2022-07-29 DIAGNOSIS — R350 Frequency of micturition: Secondary | ICD-10-CM

## 2022-07-29 DIAGNOSIS — N401 Enlarged prostate with lower urinary tract symptoms: Secondary | ICD-10-CM

## 2022-07-29 DIAGNOSIS — N5201 Erectile dysfunction due to arterial insufficiency: Secondary | ICD-10-CM | POA: Diagnosis not present

## 2022-07-29 NOTE — Progress Notes (Signed)
07/29/2022 12:14 PM   Zachary Chung Oct 28, 1960 [id number]  Referring provider: Albina Billet, MD [address]   [name],  [name] [address]  Chief Complaint  Patient presents with   Other    Urologic history: 1.  Elevated PSA Prostate MRI 11/2020 PSA upper 5 range with PI-RADS 5 lesion right PZ; prostate volume 59.1 cc Fusion biopsy [facility] 01/24/2021; all cores benign prostate tissue  2.  Erectile dysfunction Tadalafil prn  HPI: 62 y.o. male presents for follow-up visit.  Since his last visit he has noted some increased urinary frequency and urgency Last PSA May 2023 stable at 6.0 Tadalafil has not been as effective for ED with some difficulty maintaining an erection   PMH: Past Medical History:  Diagnosis Date   Adenomatous polyp of ascending colon    Anxiety    Arthritis    Depression    H/O NO MEDS   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)    left calf   ED (erectile dysfunction)    Elevated PSA    GERD (gastroesophageal reflux disease)    Hypertension    Pneumonia 1994   Sleep apnea    wears BI-PAP   Wears glasses     Surgical History: Past Surgical History:  Procedure Laterality Date   ARTERY REPAIR Left 06/19/2021   Procedure: STITCH AND REPAIR OF ARTERY;  Surgeon: Thornton Park, MD;  Location: [facility];  Service: Orthopedics;  Laterality: Left;  DR. Lucky Cowboy   BIOPSY  01/26/2019   Procedure: BIOPSY;  Surgeon: Laurence Spates, MD;  [facility];  Service: Endoscopy;;   COLONOSCOPY     COLONOSCOPY WITH PROPOFOL N/A 05/04/2017   Procedure: COLONOSCOPY WITH PROPOFOL;  Surgeon: Laurence Spates, MD;  [facility];  Service: Endoscopy;  Laterality: N/A;   COLONOSCOPY WITH PROPOFOL N/A 01/26/2019   Procedure: COLONOSCOPY WITH PROPOFOL;  Surgeon: Laurence Spates, MD;  [facility];  Service: Endoscopy;  Laterality: N/A;   COLONOSCOPY WITH PROPOFOL N/A 06/26/2022   Procedure: COLONOSCOPY WITH PROPOFOL;  Surgeon: Lucilla Lame,  MD;  Location: [facility];  Service: Endoscopy;  Laterality: N/A;  wants to be first   [place] 05/04/2017   Procedure: HOT HEMOSTASIS (ARGON PLASMA COAGULATION/BICAP);  Surgeon: Laurence Spates, MD;  [facility];  Service: Endoscopy;  Laterality: N/A;   KNEE ARTHROSCOPY W/ ACL RECONSTRUCTION     left   KNEE ARTHROSCOPY WITH MENISCAL REPAIR Left 12/12/2020   Procedure: KNEE ARTHROSCOPY WITH MENISCAL REPAIR;  Surgeon: Thornton Park, MD;  Location: [facility];  Service: Orthopedics;  Laterality: Left;   POLYPECTOMY  01/26/2019   Procedure: POLYPECTOMY;  Surgeon: Laurence Spates, MD;  [facility];  Service: Endoscopy;;   SEPTOPLASTY     SINUS SURGERY WITH INSTATRAK  05/20/2017   @ [place] Left 06/19/2021   Procedure: LEFT TOTAL KNEE ARTHROPLASTY;  Surgeon: Thornton Park, MD;  Location: [facility];  Service: Orthopedics;  Laterality: Left;  DR. Mack Guise   TRANSANAL EXCISION OF RECTAL MASS N/A 06/03/2017   Procedure: TRANSANAL EXCISION OF RECTAL POLYP;  Surgeon: [name], MD;  Location: [facility];  Service: General;  Laterality: N/A;   WISDOM TOOTH EXTRACTION      Home Medications:  Allergies as of 07/29/2022       Reactions   Tetracyclines & Related Rash        Medication List        Accurate  as of July 29, 2022 12:14 PM. If you have any questions, ask your nurse or doctor.          STOP taking these medications    cholestyramine 4 g packet Commonly known as: QUESTRAN Stopped by: Abbie Sons, MD   Clenpiq 10-3.5-12 MG-GM -GM/160ML Soln Generic drug: Sod Picosulfate-Mag Ox-Cit Acd Stopped by: Abbie Sons, MD   ondansetron 4 MG tablet Commonly known as: ZOFRAN Stopped by: Abbie Sons, MD   Oxycodone HCl 10 MG Tabs Stopped by: Abbie Sons, MD       TAKE these medications    ALPRAZolam 1 MG tablet Commonly known as: XANAX Take one tablet by mouth twice a  day (1 po bid)   amLODipine 5 MG tablet Commonly known as: NORVASC Take one tablet by mouth daily (take 1 tablet (5 mg) by oral route once daily)   Carestart COVID-19 Home Test Kit Generic drug: COVID-19 At Home Antigen Test Use as directed   diphenoxylate-atropine 2.5-0.025 MG tablet Commonly known as: LOMOTIL Take 1 to 2 tablets by mouth 4 times a day as needed (take 1- 2 tablets (5 mg) by oral route 4 times per day as needed)   naproxen 500 MG tablet Commonly known as: NAPROSYN take 1 tablet (500 mg) by oral route 2 times per day with food   pantoprazole 40 MG tablet Commonly known as: PROTONIX Take 1 tablet (40 mg total) by mouth daily.   tadalafil 20 MG tablet Commonly known as: CIALIS TAKE 1 TABLET BY MOUTH DAILY AS NEEDED FOR ED. TAKE 30-60 MINUTES PRIOR TO INTERCOURSE.   telmisartan 80 MG tablet Commonly known as: MICARDIS take 1 tablet (80 mg) by mouth once daily (take 1 tablet (80 mg) by oral route once daily)   Viberzi 100 MG Tabs Generic drug: Eluxadoline Take 1 tablet (100 mg total) by mouth 2 (two) times daily.        Allergies:  Allergies  Allergen Reactions   Tetracyclines & Related Rash    Family History: Family History  Problem Relation Age of Onset   Parkinson's disease Mother    Colon polyps Mother    Heart disease Father    Colon cancer Maternal Uncle 60    Social History:  reports that he has been smoking cigars. He has never used smokeless tobacco. He reports current alcohol use of about 21.0 standard drinks of alcohol per week. He reports that he does not use drugs.   Physical Exam: BP (!) 177/98   Pulse 76   Ht '6\' 4"'$  (1.93 m)   Wt 260 lb (117.9 kg)   BMI 31.65 kg/m   Constitutional:  Alert and oriented, No acute distress. HEENT: [place] AT Respiratory: Normal respiratory effort, no increased work of breathing. Psychiatric: Normal mood and affect.   Assessment & Plan:    1.  Elevated PSA Most recent PSA stable He is due  for a follow-up appointment with PCP in the near future and will request a PSA at that time  2.  BPH with LUTS Moderate prostate enlargement on MRI Discussed tamsulosin.  He has had some increased blood pressure and will discuss doxazosin with his PCP  3.  Erectile dysfunction He inquired about intracavernosal injections and was provided literature if interested in pursuing he will call back Also discussed the use of a venous compression band which may improve the efficacy of tadalafil Tadalafil refill sent to [facility] [address], [place], [place] [address] [phone]

## 2022-07-30 DIAGNOSIS — F4321 Adjustment disorder with depressed mood: Secondary | ICD-10-CM | POA: Diagnosis not present

## 2022-07-31 DIAGNOSIS — R972 Elevated prostate specific antigen [PSA]: Secondary | ICD-10-CM | POA: Diagnosis not present

## 2022-07-31 DIAGNOSIS — R7309 Other abnormal glucose: Secondary | ICD-10-CM | POA: Diagnosis not present

## 2022-07-31 DIAGNOSIS — N400 Enlarged prostate without lower urinary tract symptoms: Secondary | ICD-10-CM | POA: Diagnosis not present

## 2022-07-31 DIAGNOSIS — I10 Essential (primary) hypertension: Secondary | ICD-10-CM | POA: Diagnosis not present

## 2022-08-20 ENCOUNTER — Other Ambulatory Visit: Payer: Self-pay

## 2022-08-31 ENCOUNTER — Other Ambulatory Visit: Payer: Self-pay | Admitting: Urology

## 2022-08-31 MED ORDER — TADALAFIL 20 MG PO TABS: ORAL_TABLET | ORAL | 3 refills | Status: DC

## 2023-09-06 ENCOUNTER — Ambulatory Visit (INDEPENDENT_AMBULATORY_CARE_PROVIDER_SITE_OTHER): Payer: 59 | Admitting: Urology

## 2023-09-06 ENCOUNTER — Encounter: Payer: Self-pay | Admitting: Urology

## 2023-09-06 VITALS — BP 151/93 | HR 97 | Ht 76.0 in | Wt 260.0 lb

## 2023-09-06 DIAGNOSIS — Z8639 Personal history of other endocrine, nutritional and metabolic disease: Secondary | ICD-10-CM | POA: Diagnosis not present

## 2023-09-06 DIAGNOSIS — R972 Elevated prostate specific antigen [PSA]: Secondary | ICD-10-CM

## 2023-09-06 DIAGNOSIS — N529 Male erectile dysfunction, unspecified: Secondary | ICD-10-CM | POA: Diagnosis not present

## 2023-09-06 DIAGNOSIS — N401 Enlarged prostate with lower urinary tract symptoms: Secondary | ICD-10-CM

## 2023-09-06 MED ORDER — TADALAFIL 20 MG PO TABS: ORAL_TABLET | ORAL | 3 refills | Status: DC

## 2023-09-06 NOTE — Progress Notes (Unsigned)
 I, Maysun Anabel Bene, acting as a scribe for Riki Altes, MD., have documented all relevant documentation on the behalf of Riki Altes, MD, as directed by Riki Altes, MD while in the presence of Riki Altes, MD.  09/06/2023 2:22 PM   Zachary Chung 10-11-1960 [id number]  Referring provider: Jaclyn Shaggy, MD [address]   [place],  [place] [address]  Chief Complaint  Patient presents with   Elevated PSA   Urologic history: 1.  Elevated PSA Prostate MRI 11/2020 PSA upper 5 range with PI-RADS 5 lesion right PZ; prostate volume 59.1 cc Fusion biopsy [facility] 01/24/2021; all cores benign prostate tissue   2.  Erectile dysfunction Tadalafil prn  HPI: Zachary Chung is a 63 y.o. male presents for follow-up visit.   PSA performed August 2024 was stable at 5.5 He has had a worsening erectile dysfunction. Previously on tadalafil. He had erections firm enough for penetration 50% of the time, however recently it has been approximately 10% of the time with tadalafil.  He has a history of low testosterone and was inquiring if testosterone may improve his efficacy of PDE5 inhibitor therapy. He does have tiredness and fatigue. Libido is good. Stable lower urinary tract symptoms.   PMH: Past Medical History:  Diagnosis Date   Adenomatous polyp of ascending colon    Anxiety    Arthritis    Depression    H/O NO MEDS   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)    left calf   ED (erectile dysfunction)    Elevated PSA    GERD (gastroesophageal reflux disease)    Hypertension    Pneumonia 1994   Sleep apnea    wears BI-PAP   Wears glasses     Surgical History: Past Surgical History:  Procedure Laterality Date   ARTERY REPAIR Left 06/19/2021   Procedure: STITCH AND REPAIR OF ARTERY;  Surgeon: Juanell Fairly, MD;  Location: [facility];  Service: Orthopedics;  Laterality: Left;  DR. Wyn Quaker   BIOPSY  01/26/2019   Procedure: BIOPSY;  Surgeon: Carman Ching, MD;   [facility];  Service: Endoscopy;;   COLONOSCOPY     COLONOSCOPY WITH PROPOFOL N/A 05/04/2017   Procedure: COLONOSCOPY WITH PROPOFOL;  Surgeon: Carman Ching, MD;  [facility];  Service: Endoscopy;  Laterality: N/A;   COLONOSCOPY WITH PROPOFOL N/A 01/26/2019   Procedure: COLONOSCOPY WITH PROPOFOL;  Surgeon: Carman Ching, MD;  [facility];  Service: Endoscopy;  Laterality: N/A;   COLONOSCOPY WITH PROPOFOL N/A 06/26/2022   Procedure: COLONOSCOPY WITH PROPOFOL;  Surgeon: Midge Minium, MD;  Location: [facility];  Service: Endoscopy;  Laterality: N/A;  wants to be first   HOT HEMOSTASIS N/A 05/04/2017   Procedure: HOT HEMOSTASIS (ARGON PLASMA COAGULATION/BICAP);  Surgeon: Carman Ching, MD;  [facility];  Service: Endoscopy;  Laterality: N/A;   KNEE ARTHROSCOPY W/ ACL RECONSTRUCTION     left   KNEE ARTHROSCOPY WITH MENISCAL REPAIR Left 12/12/2020   Procedure: KNEE ARTHROSCOPY WITH MENISCAL REPAIR;  Surgeon: Juanell Fairly, MD;  Location: [facility];  Service: Orthopedics;  Laterality: Left;   POLYPECTOMY  01/26/2019   Procedure: POLYPECTOMY;  Surgeon: Carman Ching, MD;  [facility];  Service: Endoscopy;;   SEPTOPLASTY     SINUS SURGERY WITH INSTATRAK  05/20/2017   @ [facility] WITH [facility]   TOTAL KNEE ARTHROPLASTY Left 06/19/2021   Procedure: LEFT TOTAL KNEE ARTHROPLASTY;  Surgeon: Juanell Fairly, MD;  Location: [facility];  Service: Orthopedics;  Laterality: Left;  DR. Martha Clan   TRANSANAL EXCISION OF RECTAL MASS N/A 06/03/2017   Procedure: TRANSANAL EXCISION OF RECTAL POLYP;  Surgeon: Earline Mayotte, MD;  Location: [facility];  Service: General;  Laterality: N/A;   WISDOM TOOTH EXTRACTION      Home Medications:  Allergies as of 09/06/2023       Reactions   Doxycycline Rash   Tetracycline Rash   Tetracyclines & Related Rash        Medication List        Accurate as of September 06, 2023  2:22 PM. If you have any  questions, ask your nurse or doctor.          STOP taking these medications    ALPRAZolam 1 MG tablet Commonly known as: XANAX Stopped by: Riki Altes   Carestart COVID-19 Home Test Kit Generic drug: COVID-19 At Home Antigen Test Stopped by: Riki Altes   diphenoxylate-atropine 2.5-0.025 MG tablet Commonly known as: LOMOTIL Stopped by: Riki Altes   naproxen 500 MG tablet Commonly known as: NAPROSYN Stopped by: Riki Altes   Viberzi 100 MG Tabs Generic drug: Eluxadoline Stopped by: Riki Altes       TAKE these medications    amLODipine 5 MG tablet Commonly known as: NORVASC Take one tablet by mouth daily (take 1 tablet (5 mg) by oral route once daily)   pantoprazole 40 MG tablet Commonly known as: PROTONIX Take 1 tablet (40 mg total) by mouth daily.   tadalafil 20 MG tablet Commonly known as: CIALIS TAKE 1 TABLET BY MOUTH DAILY AS NEEDED FOR ED. TAKE 30-60 MINUTES PRIOR TO INTERCOURSE.   telmisartan 80 MG tablet Commonly known as: MICARDIS Take 1 tablet (80 mg) by mouth once daily (take 1 tablet (80 mg) by oral route once daily)        Allergies:  Allergies  Allergen Reactions   Doxycycline Rash   Tetracycline Rash   Tetracyclines & Related Rash    Family History: Family History  Problem Relation Age of Onset   Parkinson's disease Mother    Colon polyps Mother    Heart disease Father    Colon cancer Maternal Uncle 67    Social History:  reports that he has been smoking cigars. He has never used smokeless tobacco. He reports current alcohol use of about 21.0 standard drinks of alcohol per week. He reports that he does not use drugs.   Physical Exam: BP (!) 151/93   Pulse 97   Ht 6\' 4"  (1.93 m)   Wt 260 lb (117.9 kg)   BMI 31.65 kg/m   Constitutional:  Alert and oriented, No acute distress. HEENT: [place] AT, moist mucus membranes.  Trachea midline, no masses. Cardiovascular: No clubbing, cyanosis, or  edema. Respiratory: Normal respiratory effort, no increased work of breathing. GI: Abdomen is soft, nontender, nondistended, no abdominal masses Skin: No rashes, bruises or suspicious lesions. Neurologic: Grossly intact, no focal deficits, moving all 4 extremities. Psychiatric: Normal mood and affect.   Assessment & Plan:    1. Elevated PSA Previous benign prostate biopsy and stable PSA DRE was performed by his PCP in August  2. Erectile dysfunction  3. History of hypogonadism Schedule a AM testosterone level and LH.  He has been on Clonidine topical testosterone in the past, which had not been effective. He would be interested in a 3 month trial of testosterone injections. Tadalafil was refilled.  [facility] [address], [address] [place], [place] [address] [phone]

## 2023-09-08 MED ORDER — TADALAFIL 20 MG PO TABS: ORAL_TABLET | ORAL | 3 refills | Status: AC

## 2023-09-10 ENCOUNTER — Encounter: Payer: Self-pay | Admitting: Urology

## 2023-09-15 ENCOUNTER — Other Ambulatory Visit: Payer: Self-pay | Admitting: Urology

## 2023-09-16 LAB — LUTEINIZING HORMONE: LH: 9.9 m[IU]/mL — ABNORMAL HIGH (ref 1.7–8.6)

## 2023-09-16 LAB — TESTOSTERONE: Testosterone: 368 ng/dL (ref 264–916)

## 2023-09-20 ENCOUNTER — Other Ambulatory Visit: Payer: Self-pay | Admitting: Urology

## 2023-09-20 MED ORDER — TESTOSTERONE CYPIONATE 200 MG/ML IM SOLN: 160.0000 mg | INTRAMUSCULAR | 1 refills | Status: DC

## 2024-02-14 ENCOUNTER — Other Ambulatory Visit: Payer: Self-pay | Admitting: Urology

## 2024-02-28 ENCOUNTER — Ambulatory Visit (INDEPENDENT_AMBULATORY_CARE_PROVIDER_SITE_OTHER): Admitting: Dermatology

## 2024-02-28 DIAGNOSIS — D485 Neoplasm of uncertain behavior of skin: Secondary | ICD-10-CM

## 2024-02-28 DIAGNOSIS — C44519 Basal cell carcinoma of skin of other part of trunk: Secondary | ICD-10-CM | POA: Diagnosis not present

## 2024-02-28 DIAGNOSIS — C4491 Basal cell carcinoma of skin, unspecified: Secondary | ICD-10-CM

## 2024-02-28 DIAGNOSIS — D225 Melanocytic nevi of trunk: Secondary | ICD-10-CM

## 2024-02-28 HISTORY — DX: Basal cell carcinoma of skin, unspecified: C44.91

## 2024-02-28 NOTE — Patient Instructions (Addendum)

## 2024-02-28 NOTE — Progress Notes (Signed)
   Follow-Up Visit   Subjective  Zachary Chung is a 63 y.o. male who presents for the following: Lesion on the back that he noticed around 07/2023, pt wife noticed and he would like checked today. Pt states no known hx of rupture or drainage at site.  The following portions of the chart were reviewed this encounter and updated as appropriate: medications, allergies, medical history  Review of Systems:  No other skin or systemic complaints except as noted in HPI or Assessment and Plan.  Objective  Well appearing patient in no apparent distress; mood and affect are within normal limits.  A focused examination was performed of the following areas: the back  Relevant exam findings are noted in the Assessment and Plan.  upper back spinal 1.2 x 0.7 cm indurated plaque with fine telangiectasias   Assessment & Plan  NEOPLASM OF UNCERTAIN BEHAVIOR OF SKIN upper back spinal Skin / nail biopsy Type of biopsy: tangential   Informed consent: discussed and consent obtained   Timeout: patient name, date of birth, surgical site, and procedure verified   Procedure prep:  Patient was prepped and draped in usual sterile fashion Prep type:  Isopropyl alcohol Anesthesia: the lesion was anesthetized in a standard fashion   Anesthetic:  1% lidocaine  w/ epinephrine  1-100,000 buffered w/ 8.4% NaHCO3 Instrument used: flexible razor blade   Hemostasis achieved with: pressure, aluminum chloride and electrodesiccation   Outcome: patient tolerated procedure well   Post-procedure details: sterile dressing applied and wound care instructions given   Dressing type: bandage (Mupirocin 2% ointment)    Specimen 1 - Surgical pathology Differential Diagnosis: D48.5 scar tissues r/o morpheaform basal cell carcinoma vs cyst Check Margins: No  Return if symptoms worsen or fail to improve.  LILLETTE Rosina Mayans, CMA, am acting as scribe for [name], MD .   Documentation: I have reviewed the above  documentation for accuracy and completeness, and I agree with the above.  [name], MD

## 2024-02-29 ENCOUNTER — Encounter: Payer: Self-pay | Admitting: Dermatology

## 2024-03-06 ENCOUNTER — Ambulatory Visit: Payer: Self-pay | Admitting: Dermatology

## 2024-03-06 DIAGNOSIS — C44519 Basal cell carcinoma of skin of other part of trunk: Secondary | ICD-10-CM

## 2024-03-06 LAB — SURGICAL PATHOLOGY

## 2024-03-07 NOTE — Telephone Encounter (Signed)
-----   Message from [name] sent at 03/06/2024  5:31 PM EDT ----- FINAL DIAGNOSIS        1. Skin, upper back spinal :       BASAL CELL CARCINOMA, MORPHEAFORM TYPE   Cancer = BCC Morpheaform type Recommend [facility] - Dr Dorn Ahle at [facility] Discussed with pt. - Please make referral to [facility] / Dr Ahle for MOHS ----- Message ----- From: Interface, Lab In [facility] [place] [facility] Sent: 03/06/2024   4:20 PM EDT To: Alm JAYSON Rhyme, MD

## 2024-03-16 NOTE — Telephone Encounter (Addendum)
 Called to check status of Zachary Chung referral sent to Dr. Cricket office . Patient is scheduled for appt on December 19 at 9:00 am   ----- Message from [name] sent at 03/06/2024  5:31 PM EDT ----- FINAL DIAGNOSIS        1. Skin, upper back spinal :       BASAL CELL CARCINOMA, MORPHEAFORM TYPE   Cancer = BCC Morpheaform type Recommend Zachary Chung - Dr Dorn Ahle at [facility] Discussed with pt. - Please make referral to [facility] / Dr Ahle for Zachary Chung ----- Message ----- From: Interface, Lab In [facility] [place] [facility] Sent: 03/06/2024   4:20 PM EDT To: Alm JAYSON Rhyme, MD

## 2024-06-12 ENCOUNTER — Ambulatory Visit: Admitting: Urology

## 2024-06-26 ENCOUNTER — Ambulatory Visit (INDEPENDENT_AMBULATORY_CARE_PROVIDER_SITE_OTHER): Admitting: Urology

## 2024-06-26 ENCOUNTER — Encounter: Payer: Self-pay | Admitting: Urology

## 2024-06-26 VITALS — BP 131/83 | HR 73 | Ht 76.0 in | Wt 260.0 lb

## 2024-06-26 DIAGNOSIS — R972 Elevated prostate specific antigen [PSA]: Secondary | ICD-10-CM | POA: Diagnosis not present

## 2024-06-26 DIAGNOSIS — N5201 Erectile dysfunction due to arterial insufficiency: Secondary | ICD-10-CM

## 2024-06-26 NOTE — Progress Notes (Signed)
 06/26/2024 1:16 PM   Zachary Chung [place] 1960/11/11 [id number]  Referring provider: No referring provider defined for this encounter.  Chief Complaint  Patient presents with   Elevated PSA    Urologic history 1.  Elevated PSA Prostate MRI 12/06/2020: PSA 5.9, volume 59 cc; PI-RADS 5 lesion right posterolateral apex MR fusion biopsy: [facility] 01/24/2021; standard 12 core template +4 ROI samples: All negative   HPI: Zachary Chung is a 63 y.o. male presents for follow-up visit.  Since last visit PSA has increased slightly Mild lower urinary tract symptoms PSA 06/08/2024 was 6.87 Tried testosterone  for 2 months without improvement in symptoms including ED and discontinued ED now PDE 5 inhibitor refractory and he was interested in pursuing intracavernosal injections  PSA trend    Prostate Specific Ag, Serum  Latest Ref Rng 0.0 - 4.0 ng/mL  10/17/2019 5.9  03/2020 5.8   10/08/2020 5.9  11/2021 6.0  02/2023 5.5  06/08/2024 6.87     PMH: Past Medical History:  Diagnosis Date   Adenomatous polyp of ascending colon    Anxiety    Arthritis    Depression    H/O NO MEDS   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)    left calf   ED (erectile dysfunction)    Elevated PSA    GERD (gastroesophageal reflux disease)    Hypertension    Pneumonia 1994   Sleep apnea    wears BI-PAP   Wears glasses     Surgical History: Past Surgical History:  Procedure Laterality Date   ARTERY REPAIR Left 06/19/2021   Procedure: STITCH AND REPAIR OF ARTERY;  Surgeon: Marchia Drivers, MD;  Location: [facility];  Service: Orthopedics;  Laterality: Left;  DR. MAREA   BIOPSY  01/26/2019   Procedure: BIOPSY;  Surgeon: Celestia Agent, MD;  Location: [facility];  Service: Endoscopy;;   COLONOSCOPY     COLONOSCOPY WITH PROPOFOL  N/A 05/04/2017   Procedure: COLONOSCOPY WITH PROPOFOL ;  Surgeon: Celestia Agent, MD;  Location: [facility];  Service: Endoscopy;  Laterality: N/A;   COLONOSCOPY WITH PROPOFOL   N/A 01/26/2019   Procedure: COLONOSCOPY WITH PROPOFOL ;  Surgeon: Celestia Agent, MD;  Location: [facility];  Service: Endoscopy;  Laterality: N/A;   COLONOSCOPY WITH PROPOFOL  N/A 06/26/2022   Procedure: COLONOSCOPY WITH PROPOFOL ;  Surgeon: Jinny Carmine, MD;  Location: [facility];  Service: Endoscopy;  Laterality: N/A;  wants to be first   HOT HEMOSTASIS N/A 05/04/2017   Procedure: HOT HEMOSTASIS (ARGON PLASMA COAGULATION/BICAP);  Surgeon: Celestia Agent, MD;  Location: [facility];  Service: Endoscopy;  Laterality: N/A;   KNEE ARTHROSCOPY W/ ACL RECONSTRUCTION     left   KNEE ARTHROSCOPY WITH MENISCAL REPAIR Left 12/12/2020   Procedure: KNEE ARTHROSCOPY WITH MENISCAL REPAIR;  Surgeon: Marchia Drivers, MD;  Location: [facility];  Service: Orthopedics;  Laterality: Left;   POLYPECTOMY  01/26/2019   Procedure: POLYPECTOMY;  Surgeon: Celestia Agent, MD;  Location: [facility];  Service: Endoscopy;;   SEPTOPLASTY     SINUS SURGERY WITH INSTATRAK  05/20/2017   @ [facility] WITH MADISION Chung   TOTAL KNEE ARTHROPLASTY Left 06/19/2021   Procedure: LEFT TOTAL KNEE ARTHROPLASTY;  Surgeon: Marchia Drivers, MD;  Location: [facility];  Service: Orthopedics;  Laterality: Left;  DR. MARCHIA   TRANSANAL EXCISION OF RECTAL MASS N/A 06/03/2017   Procedure: TRANSANAL EXCISION OF RECTAL POLYP;  Surgeon: Dessa Reyes ORN, MD;  Location: [facility];  Service: General;  Laterality: N/A;   WISDOM TOOTH EXTRACTION  Home Medications:  Allergies as of 06/26/2024       Reactions   Doxycycline Rash   Tetracycline Rash   Tetracyclines & Related Rash        Medication List        Accurate as of June 26, 2024  1:16 PM. If you have any questions, ask your nurse or doctor.          STOP taking these medications    testosterone  cypionate 200 MG/ML injection Commonly known as: DEPOTESTOSTERONE CYPIONATE Stopped by: Glendia JAYSON Barba       TAKE these medications    amLODipine  5 MG  tablet Commonly known as: NORVASC  Take one tablet by mouth daily (take 1 tablet (5 mg) by oral route once daily)   pantoprazole  40 MG tablet Commonly known as: PROTONIX  Take 1 tablet (40 mg total) by mouth daily.   tadalafil  20 MG tablet Commonly known as: CIALIS  TAKE 1 TABLET BY MOUTH DAILY AS NEEDED FOR ED. TAKE 30-60 MINUTES PRIOR TO INTERCOURSE.   telmisartan  80 MG tablet Commonly known as: MICARDIS  Take 1 tablet (80 mg) by mouth once daily (take 1 tablet (80 mg) by oral route once daily)   triamterene-hydrochlorothiazide 37.5-25 MG capsule Commonly known as: DYAZIDE Take 1 capsule by mouth daily.        Allergies:  Allergies  Allergen Reactions   Doxycycline Rash   Tetracycline Rash   Tetracyclines & Related Rash    Family History: Family History  Problem Relation Age of Onset   Parkinson's disease Mother    Colon polyps Mother    Heart disease Father    Colon cancer Maternal Uncle 77    Social History:  reports that he has been smoking cigars. He has never used smokeless tobacco. He reports current alcohol use of about 21.0 standard drinks of alcohol per week. He reports that he does not use drugs.   Physical Exam: BP 131/83   Pulse 73   Ht 6' 4 (1.93 m)   Wt 260 lb (117.9 kg)   BMI 31.65 kg/m   Constitutional:  Alert, No acute distress. HEENT: [place] AT Respiratory: Normal respiratory effort, no increased work of breathing. GU: Phallus without lesions.  Testes descended bilaterally without masses or tenderness Psychiatric: Normal mood and affect.   Assessment & Plan:    1.  Elevated PSA Most recent PSA has increased slightly Will repeat May 2025  2.  Erectile dysfunction PDE 5 inhibitor refractory; has titrated tadalafil  to 60 mg We reviewed landmarks of the dorsal penile vein/neurovascular bundle anteriorly and urethra posteriorly.  I demonstrated the injection sites for intracavernosal at the left or right corpus cavernosum proximal mid  shaft We discussed potential side effects of priapism and corporal scarring.  Not recommended to inject more than 2-3 times weekly Instructed to call for prolonged erection lasting longer than 2 hours Rx for Trimix was sent to [facility] compounding last week   Glendia JAYSON Barba, MD  [facility] [address], [address] [place], [place] [address] [phone]

## 2024-07-31 ENCOUNTER — Telehealth: Payer: Self-pay

## 2024-07-31 NOTE — Telephone Encounter (Signed)
 Specimen tracking and history updated from [facility] progress notes of [facility], mid upper back.
